# Patient Record
Sex: Male | Born: 1970 | Race: Black or African American | Hispanic: No | Marital: Married | Smoking: Never smoker
Health system: Southern US, Community
[De-identification: ages and names within clinical notes are randomized; demographics above are authoritative.]

## PROBLEM LIST (undated history)

## (undated) DIAGNOSIS — E785 Hyperlipidemia, unspecified: Secondary | ICD-10-CM

## (undated) DIAGNOSIS — R55 Syncope and collapse: Secondary | ICD-10-CM

## (undated) DIAGNOSIS — G4730 Sleep apnea, unspecified: Secondary | ICD-10-CM

## (undated) DIAGNOSIS — M1990 Unspecified osteoarthritis, unspecified site: Secondary | ICD-10-CM

## (undated) DIAGNOSIS — T7840XA Allergy, unspecified, initial encounter: Secondary | ICD-10-CM

## (undated) HISTORY — DX: Unspecified osteoarthritis, unspecified site: M19.90

## (undated) HISTORY — PX: SHOULDER ARTHROSCOPY WITH LABRAL REPAIR: SHX5691

## (undated) HISTORY — PX: KNEE SURGERY: SHX244

## (undated) HISTORY — DX: Sleep apnea, unspecified: G47.30

## (undated) HISTORY — DX: Allergy, unspecified, initial encounter: T78.40XA

## (undated) HISTORY — PX: TONSILLECTOMY: SUR1361

## (undated) HISTORY — PX: KNEE ARTHROSCOPY WITH MENISCAL REPAIR: SHX5653

## (undated) HISTORY — PX: BACK SURGERY: SHX140

## (undated) HISTORY — DX: Syncope and collapse: R55

## (undated) HISTORY — PX: ANKLE SURGERY: SHX546

## (undated) HISTORY — DX: Hyperlipidemia, unspecified: E78.5

---

## 2011-04-23 ENCOUNTER — Emergency Department (HOSPITAL_COMMUNITY)
Admission: EM | Admit: 2011-04-23 | Discharge: 2011-04-23 | Disposition: A | Discharge status: Home / Self Care | Payer: BC Managed Care – PPO | Attending: Emergency Medicine | Admitting: Emergency Medicine

## 2011-04-23 DIAGNOSIS — I4902 Ventricular flutter: Secondary | ICD-10-CM | POA: Insufficient documentation

## 2011-04-23 DIAGNOSIS — N289 Disorder of kidney and ureter, unspecified: Secondary | ICD-10-CM | POA: Insufficient documentation

## 2011-04-23 DIAGNOSIS — R002 Palpitations: Secondary | ICD-10-CM | POA: Insufficient documentation

## 2011-04-23 LAB — POCT I-STAT, CHEM 8
BUN: 20 mg/dL (ref 6–23)
Calcium, Ion: 1.22 mmol/L (ref 1.12–1.32)
Creatinine, Ser: 1.5 mg/dL — ABNORMAL HIGH (ref 0.50–1.35)
Hemoglobin: 16 g/dL (ref 13.0–17.0)
TCO2: 28 mmol/L (ref 0–100)

## 2011-04-23 LAB — CBC
HCT: 42.8 % (ref 39.0–52.0)
MCHC: 35.3 g/dL (ref 30.0–36.0)
Platelets: 207 10*3/uL (ref 150–400)
RDW: 13 % (ref 11.5–15.5)
WBC: 6.6 10*3/uL (ref 4.0–10.5)

## 2011-04-23 LAB — DIFFERENTIAL
Basophils Absolute: 0 10*3/uL (ref 0.0–0.1)
Eosinophils Absolute: 0.2 10*3/uL (ref 0.0–0.7)
Eosinophils Relative: 4 % (ref 0–5)
Lymphocytes Relative: 40 % (ref 12–46)
Monocytes Absolute: 0.4 10*3/uL (ref 0.1–1.0)

## 2011-04-23 LAB — POCT I-STAT TROPONIN I: Troponin i, poc: 0 ng/mL (ref 0.00–0.08)

## 2011-09-30 ENCOUNTER — Other Ambulatory Visit: Payer: Self-pay | Admitting: Family Medicine

## 2011-09-30 DIAGNOSIS — M545 Low back pain: Secondary | ICD-10-CM

## 2011-10-04 ENCOUNTER — Ambulatory Visit
Admission: RE | Admit: 2011-10-04 | Discharge: 2011-10-04 | Disposition: A | Discharge status: Home / Self Care | Payer: BC Managed Care – PPO | Source: Ambulatory Visit | Attending: Family Medicine | Admitting: Family Medicine

## 2011-10-04 DIAGNOSIS — M545 Low back pain: Secondary | ICD-10-CM

## 2012-10-09 ENCOUNTER — Other Ambulatory Visit: Payer: Self-pay | Admitting: Neurosurgery

## 2012-10-09 DIAGNOSIS — M5126 Other intervertebral disc displacement, lumbar region: Secondary | ICD-10-CM

## 2012-10-18 ENCOUNTER — Other Ambulatory Visit: Payer: BC Managed Care – PPO

## 2012-11-13 ENCOUNTER — Inpatient Hospital Stay: Admission: RE | Admit: 2012-11-13 | Payer: BC Managed Care – PPO | Source: Ambulatory Visit

## 2012-11-19 ENCOUNTER — Other Ambulatory Visit: Payer: Self-pay | Admitting: Neurosurgery

## 2012-11-19 DIAGNOSIS — M5126 Other intervertebral disc displacement, lumbar region: Secondary | ICD-10-CM

## 2012-11-27 ENCOUNTER — Ambulatory Visit
Admission: RE | Admit: 2012-11-27 | Discharge: 2012-11-27 | Disposition: A | Discharge status: Home / Self Care | Payer: BC Managed Care – PPO | Source: Ambulatory Visit | Attending: Neurosurgery | Admitting: Neurosurgery

## 2012-11-27 DIAGNOSIS — M5126 Other intervertebral disc displacement, lumbar region: Secondary | ICD-10-CM

## 2012-11-27 MED ORDER — GADOBENATE DIMEGLUMINE 529 MG/ML IV SOLN
20.0000 mL | Freq: Once | INTRAVENOUS | Status: AC | PRN
  Administered 2012-11-27: 20 mL via INTRAVENOUS

## 2013-11-18 ENCOUNTER — Emergency Department (HOSPITAL_COMMUNITY): Payer: BC Managed Care – PPO

## 2013-11-18 ENCOUNTER — Encounter (HOSPITAL_COMMUNITY): Payer: Self-pay | Admitting: Emergency Medicine

## 2013-11-18 ENCOUNTER — Emergency Department (HOSPITAL_COMMUNITY)
Admission: EM | Admit: 2013-11-18 | Discharge: 2013-11-18 | Disposition: A | Discharge status: Home / Self Care | Payer: BC Managed Care – PPO | Attending: Emergency Medicine | Admitting: Emergency Medicine

## 2013-11-18 DIAGNOSIS — K529 Noninfective gastroenteritis and colitis, unspecified: Secondary | ICD-10-CM

## 2013-11-18 DIAGNOSIS — K5289 Other specified noninfective gastroenteritis and colitis: Secondary | ICD-10-CM | POA: Insufficient documentation

## 2013-11-18 DIAGNOSIS — R0602 Shortness of breath: Secondary | ICD-10-CM | POA: Insufficient documentation

## 2013-11-18 LAB — COMPREHENSIVE METABOLIC PANEL
ALK PHOS: 60 U/L (ref 39–117)
ALT: 25 U/L (ref 0–53)
AST: 30 U/L (ref 0–37)
Albumin: 4.5 g/dL (ref 3.5–5.2)
BUN: 13 mg/dL (ref 6–23)
CHLORIDE: 102 meq/L (ref 96–112)
CO2: 28 meq/L (ref 19–32)
Calcium: 10.1 mg/dL (ref 8.4–10.5)
Creatinine, Ser: 1.18 mg/dL (ref 0.50–1.35)
GFR, EST AFRICAN AMERICAN: 88 mL/min — AB (ref >90)
GFR, EST NON AFRICAN AMERICAN: 76 mL/min — AB (ref >90)
GLUCOSE: 97 mg/dL (ref 70–99)
POTASSIUM: 4.3 meq/L (ref 3.7–5.3)
SODIUM: 141 meq/L (ref 137–147)
Total Bilirubin: 0.4 mg/dL (ref 0.3–1.2)
Total Protein: 8.5 g/dL — ABNORMAL HIGH (ref 6.0–8.3)

## 2013-11-18 LAB — CBC WITH DIFFERENTIAL/PLATELET
BASOS ABS: 0 10*3/uL (ref 0.0–0.1)
Basophils Relative: 0 % (ref 0–1)
EOS PCT: 1 % (ref 0–5)
Eosinophils Absolute: 0.1 10*3/uL (ref 0.0–0.7)
HCT: 45.5 % (ref 39.0–52.0)
Hemoglobin: 15.3 g/dL (ref 13.0–17.0)
LYMPHS ABS: 1.1 10*3/uL (ref 0.7–4.0)
LYMPHS PCT: 13 % (ref 12–46)
MCH: 31.7 pg (ref 26.0–34.0)
MCHC: 33.6 g/dL (ref 30.0–36.0)
MCV: 94.4 fL (ref 78.0–100.0)
Monocytes Absolute: 0.7 10*3/uL (ref 0.1–1.0)
Monocytes Relative: 8 % (ref 3–12)
NEUTROS ABS: 6.5 10*3/uL (ref 1.7–7.7)
NEUTROS PCT: 78 % — AB (ref 43–77)
PLATELETS: 205 10*3/uL (ref 150–400)
RBC: 4.82 MIL/uL (ref 4.22–5.81)
RDW: 14.1 % (ref 11.5–15.5)
WBC: 8.4 10*3/uL (ref 4.0–10.5)

## 2013-11-18 LAB — URINALYSIS, ROUTINE W REFLEX MICROSCOPIC
BILIRUBIN URINE: NEGATIVE
GLUCOSE, UA: NEGATIVE mg/dL
Ketones, ur: NEGATIVE mg/dL
Leukocytes, UA: NEGATIVE
Nitrite: NEGATIVE
PROTEIN: NEGATIVE mg/dL
Specific Gravity, Urine: >1.03 — ABNORMAL HIGH (ref 1.005–1.030)
UROBILINOGEN UA: 0.2 mg/dL (ref 0.0–1.0)
pH: 6 (ref 5.0–8.0)

## 2013-11-18 LAB — LIPASE, BLOOD: Lipase: 36 U/L (ref 11–59)

## 2013-11-18 LAB — URINE MICROSCOPIC-ADD ON

## 2013-11-18 MED ORDER — ACETAMINOPHEN 10 MG/ML IV SOLN
1000.0000 mg | Freq: Four times a day (QID) | INTRAVENOUS | Status: DC
  Administered 2013-11-18: 1000 mg via INTRAVENOUS

## 2013-11-18 MED ORDER — MORPHINE SULFATE 4 MG/ML IJ SOLN
4.0000 mg | Freq: Once | INTRAMUSCULAR | Status: DC
  Filled 2013-11-18: qty 1

## 2013-11-18 MED ORDER — ONDANSETRON 4 MG PO TBDP: ORAL_TABLET | ORAL | Status: DC

## 2013-11-18 MED ORDER — IOHEXOL 300 MG/ML  SOLN
100.0000 mL | Freq: Once | INTRAMUSCULAR | Status: AC | PRN
  Administered 2013-11-18: 100 mL via INTRAVENOUS

## 2013-11-18 MED ORDER — IOHEXOL 300 MG/ML  SOLN
50.0000 mL | Freq: Once | INTRAMUSCULAR | Status: AC | PRN
  Administered 2013-11-18: 50 mL via ORAL

## 2013-11-18 MED ORDER — SODIUM CHLORIDE 0.9 % IV BOLUS (SEPSIS)
1000.0000 mL | Freq: Once | INTRAVENOUS | Status: AC
  Administered 2013-11-18: 1000 mL via INTRAVENOUS

## 2013-11-18 MED ORDER — ACETAMINOPHEN 10 MG/ML IV SOLN
INTRAVENOUS | Status: AC
  Filled 2013-11-18: qty 100

## 2013-11-18 MED ORDER — DICYCLOMINE HCL 20 MG PO TABS: ORAL_TABLET | ORAL | Status: DC

## 2013-11-18 NOTE — ED Provider Notes (Signed)
CSN: [id number]     Arrival date & time 11/18/13  1250 History   This chart was scribed for David LennertJoseph L Ayvah Caroll, MD by Manuela Schwartzaylor Day, ED scribe. This patient was seen in room APA03/APA03 and the patient's care was started at 1250.  Chief Complaint  Patient presents with  . Abdominal Pain   Patient is a 43 y.o. male presenting with abdominal pain. The history is provided by the patient. No language interpreter was used.  Abdominal Pain Pain location:  LLQ and RLQ Pain quality: sharp   Pain radiates to:  Does not radiate Pain severity:  Moderate Onset quality:  Gradual Duration:  1 day Timing:  Constant Progression:  Worsening Chronicity:  New Relieved by:  Nothing Worsened by:  Nothing tried Ineffective treatments:  OTC medications Associated symptoms: nausea and shortness of breath   Associated symptoms: no chest pain, no chills, no cough, no fever and no vomiting    HPI Comments: Langston ReusingBrian Malbrough is a 43 y.o. male who presents to the Emergency Department complaining of sharp, bilateral lower abdominal pain onset yesterday but worsened while he was on a work call a few hours ago. He reports feeling SOB while pain was at its worst. He states no relief w/pepto bismol. He reports associated nausea and x3 episodes of watery diarrhea this AM and has been dry heaving. He denies any cough or fever.   History reviewed. No pertinent past medical history. Past Surgical History  Procedure Laterality Date  . Shoulder arthroscopy with labral repair    . Back surgery    . Knee arthroscopy with meniscal repair     History reviewed. No pertinent family history. History  Substance Use Topics  . Smoking status: Never Smoker   . Smokeless tobacco: Not on file  . Alcohol Use: Yes     Comment: occasional    Review of Systems  Constitutional: Negative for fever and chills.  Respiratory: Positive for shortness of breath. Negative for cough.   Cardiovascular: Negative for chest pain.  Gastrointestinal:  Positive for nausea and abdominal pain. Negative for vomiting.  Musculoskeletal: Negative for back pain.  Skin: Negative for rash.  All other systems reviewed and are negative.   Allergies  Review of patient's allergies indicates no known allergies.  Home Medications  No current outpatient prescriptions on file.  Triage Vitals: BP 122/75  Pulse 83  Temp(Src) 98.3 F (36.8 C) (Oral)  Resp 18  Ht 6\' 5"  (1.956 m)  Wt 235 lb (106.595 kg)  BMI 27.86 kg/m2  SpO2 99%  Physical Exam  Nursing note and vitals reviewed. Constitutional: He is oriented to person, place, and time. He appears well-developed.  HENT:  Head: Normocephalic.  Eyes: Conjunctivae and EOM are normal. No scleral icterus.  Neck: Neck supple. No thyromegaly present.  Cardiovascular: Normal rate and regular rhythm.  Exam reveals no gallop and no friction rub.   No murmur heard. Pulmonary/Chest: Effort normal and breath sounds normal. No stridor. No respiratory distress. He has no wheezes. He has no rales. He exhibits no tenderness.  Abdominal: Soft. He exhibits no distension. There is tenderness. There is no rebound.  Moderate LLQ and RLQ tenderness to palpation  Musculoskeletal: Normal range of motion. He exhibits no edema.  Lymphadenopathy:    He has no cervical adenopathy.  Neurological: He is oriented to person, place, and time. He exhibits normal muscle tone. Coordination normal.  Skin: No rash noted. No erythema.  Psychiatric: He has a normal mood and affect. His  behavior is normal.    ED Course  Procedures (including critical care time) DIAGNOSTIC STUDIES: Oxygen Saturation is 99% on room air, normal by my interpretation.    COORDINATION OF CARE: At 145 PM Discussed treatment plan with patient which includes abdominal X-ray, blood work, UA. Patient agrees.   Labs Review Labs Reviewed - No data to display Imaging Review No results found.   EKG Interpretation None      MDM   Final diagnoses:   None    gastroenteritis I personally performed the services described in this documentation, which was scribed in my presence. The recorded information has been reviewed and is accurate.      David Lennert, MD 11/18/13 [phone]

## 2013-11-18 NOTE — ED Notes (Signed)
Patient requesting pain medication. MD aware. Verbal order for 4 mg Morphine IV obtained.

## 2013-11-18 NOTE — ED Notes (Signed)
Patient with no complaints at this time. Respirations even and unlabored. Skin warm/dry. Discharge instructions reviewed with patient at this time. Patient given opportunity to voice concerns/ask questions. IV removed per policy and band-aid applied to site. Patient discharged at this time and left Emergency Department with steady gait.  

## 2013-11-18 NOTE — Discharge Instructions (Signed)
Drink plenty of fluids.  Tylenol or motrin for pain as needed.  Follow up in 2-3 days if not improving.

## 2013-11-18 NOTE — ED Notes (Signed)
Pt reports intermittent sharp, cramping abdominal pain since yesterday.  Reports nausea and diarrhea.

## 2015-07-17 IMAGING — CR DG ABDOMEN ACUTE W/ 1V CHEST
4 series · 4 of 4 positions shown · non-contrast
Comparison: None

CLINICAL DATA: Abdominal pain

EXAM:
ACUTE ABDOMEN SERIES (ABDOMEN 2 VIEW & CHEST 1 VIEW)

[view not recorded (1 of 4)]
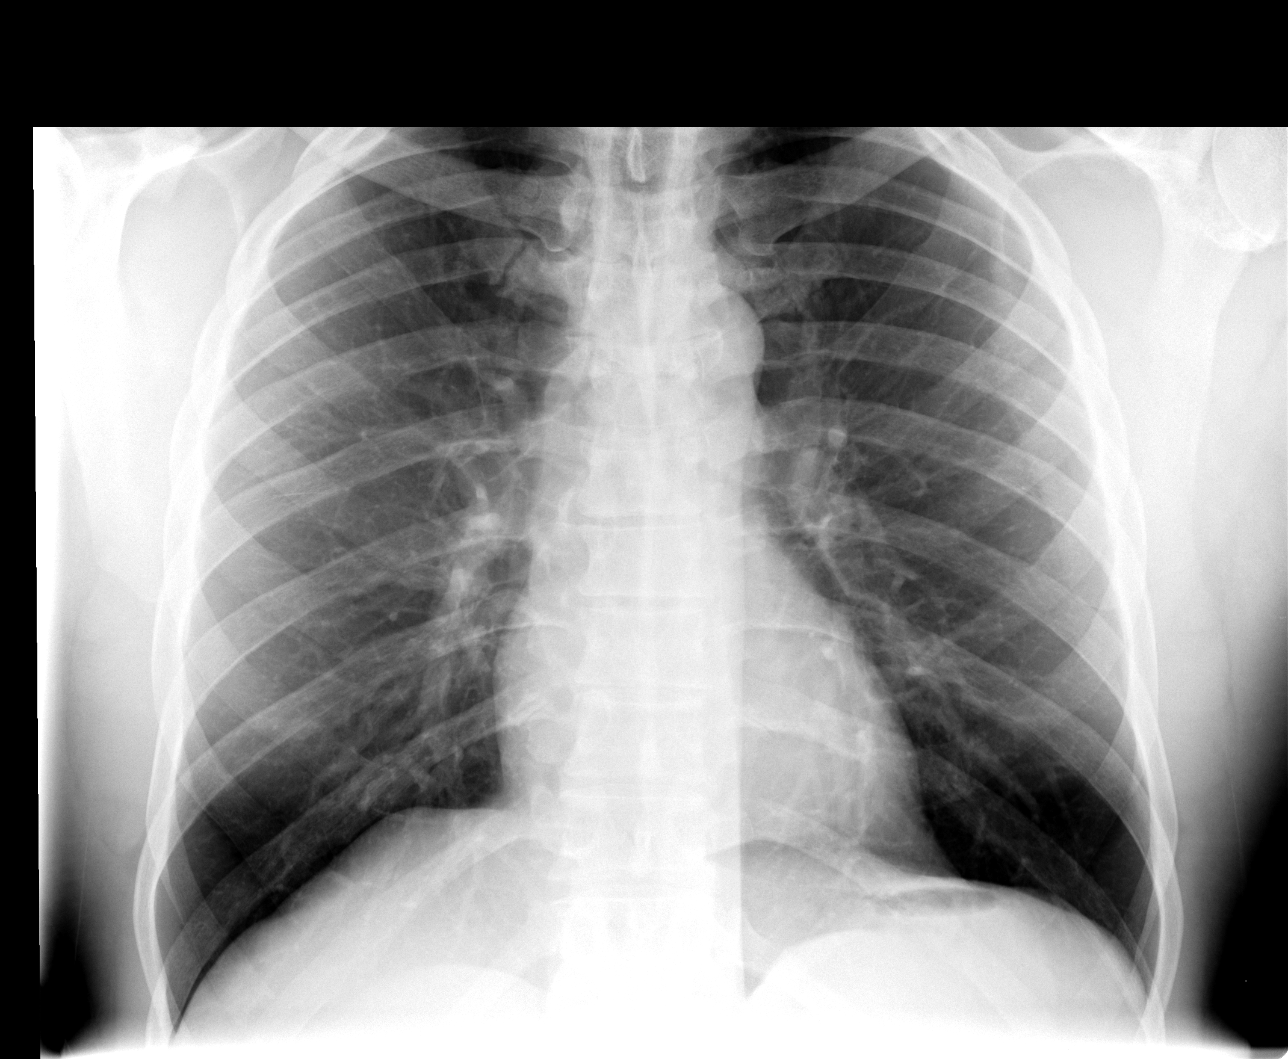

[view not recorded (2 of 4)]
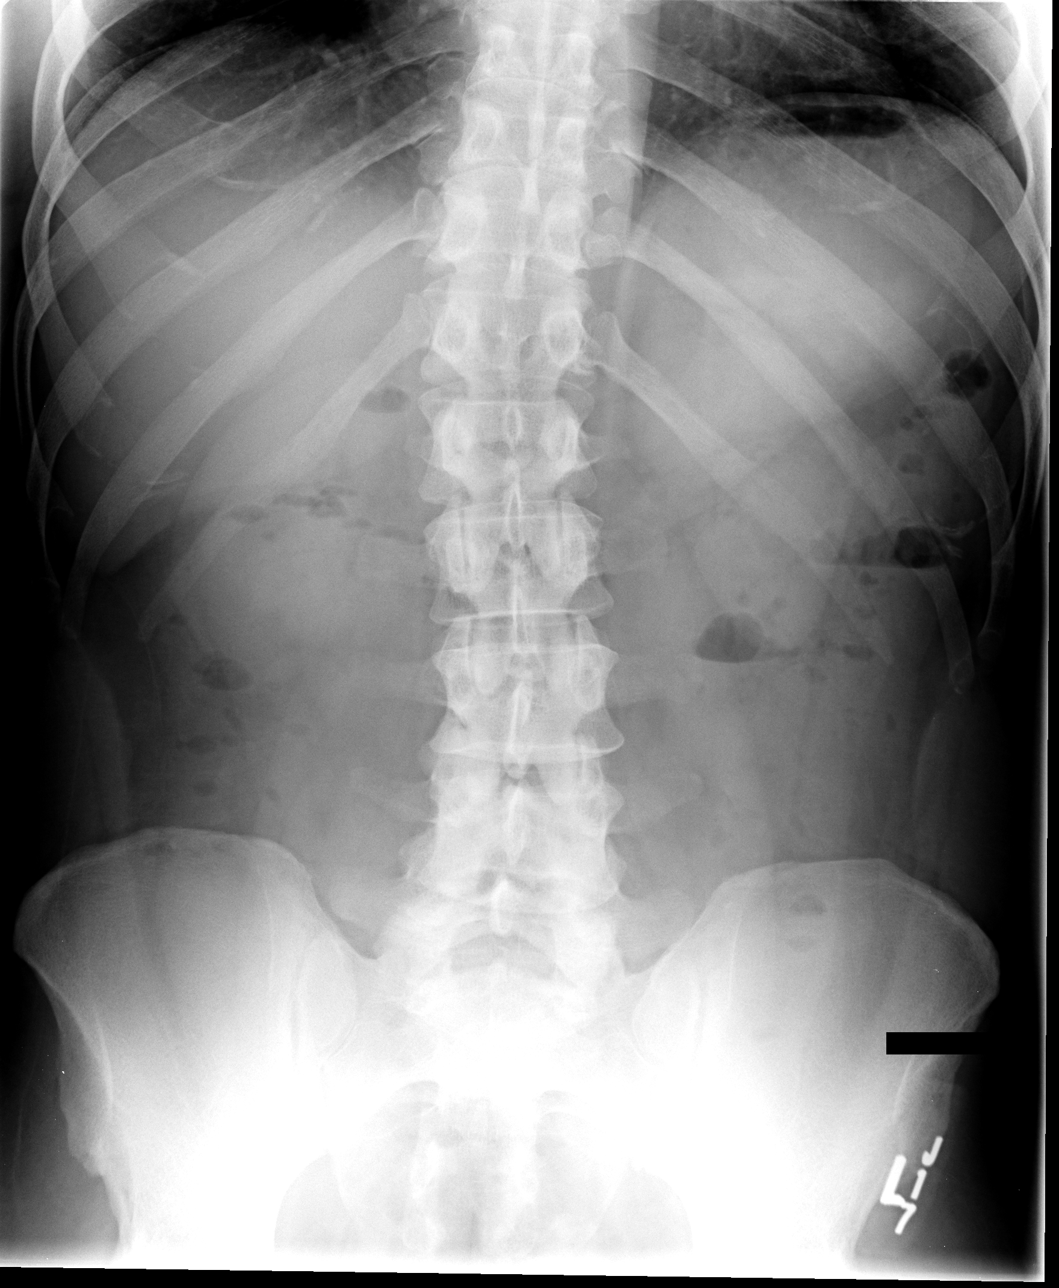

[view not recorded (3 of 4)]
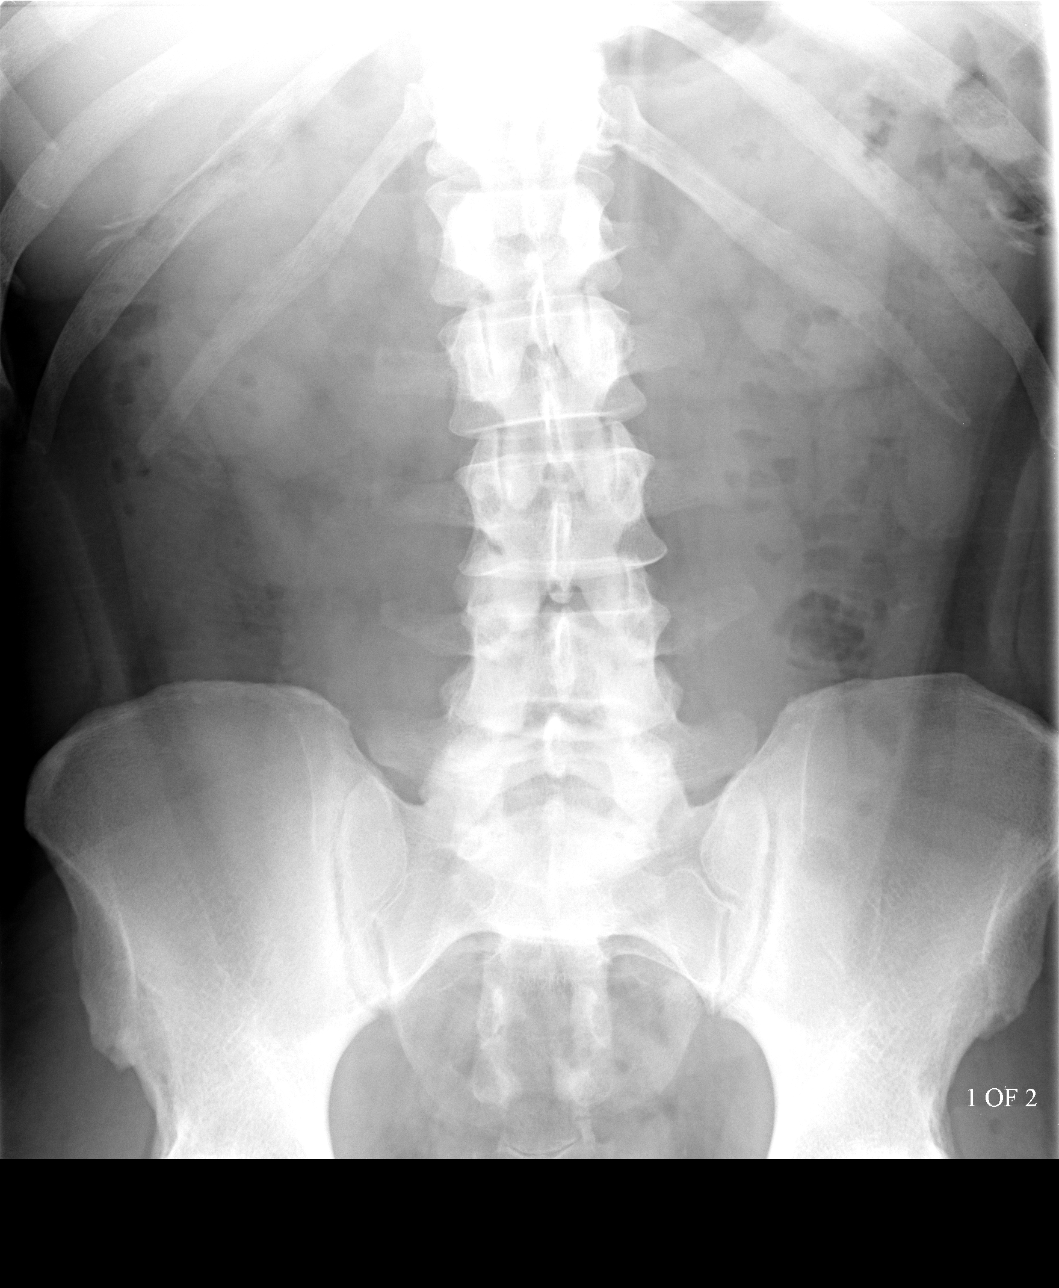

[view not recorded (4 of 4)]
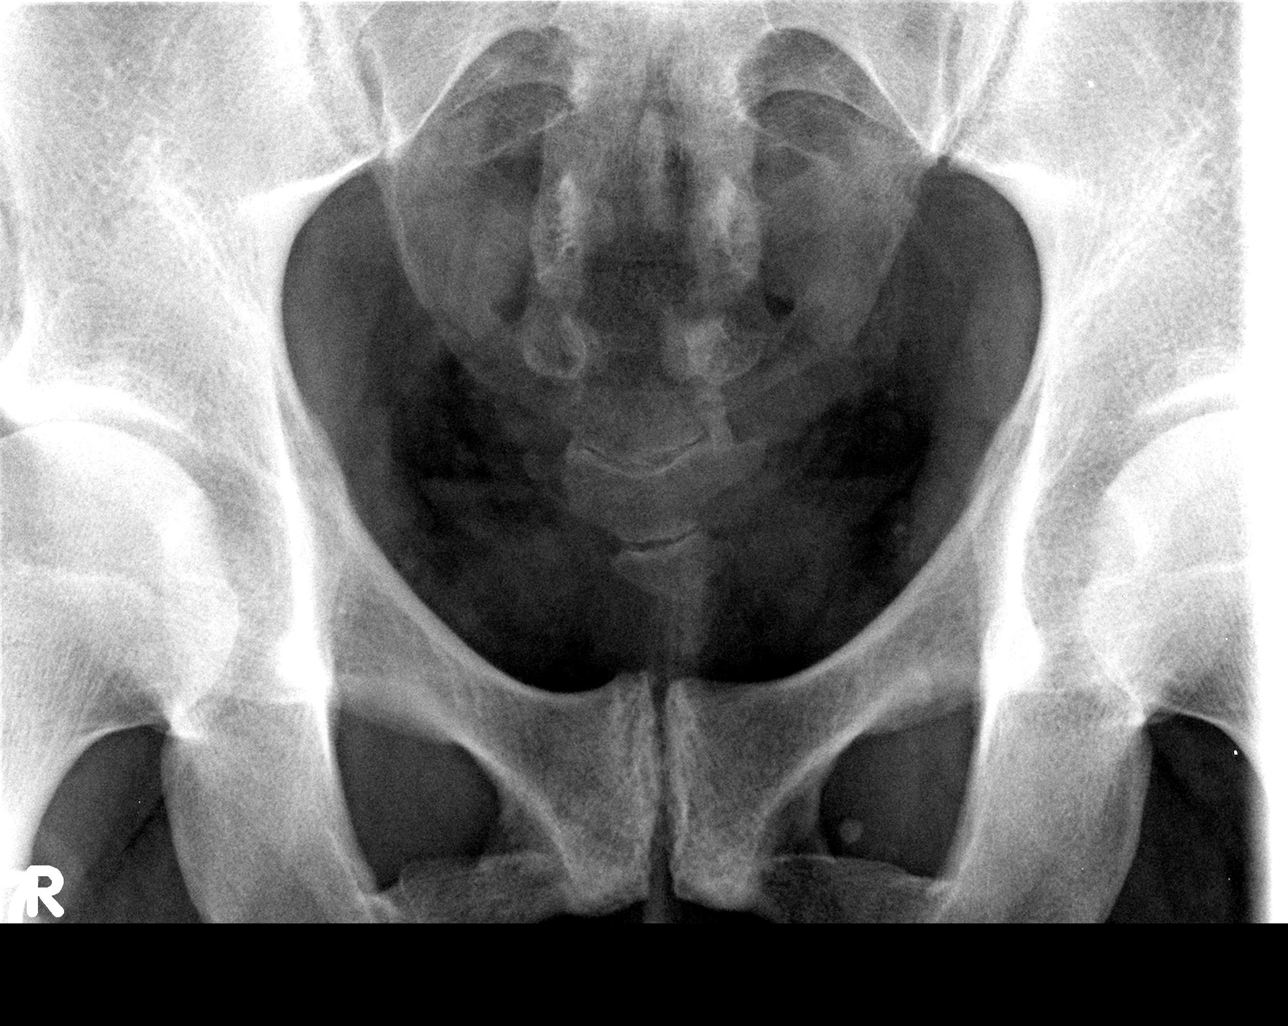

[4 of 4 positions shown; findings below may reference images not displayed]

FINDINGS: There is no evidence of dilated bowel loops or free intraperitoneal
air. A few air-fluid levels are identified within the upper abdomen.
No radiopaque calculi or other significant radiographic abnormality
is seen. Heart size and mediastinal contours are within normal
limits. Both lungs are clear.
IMPRESSION: 1. Nonspecific bowel gas pattern with a few scattered air-fluid
levels. No dilated bowel loops identified

## 2016-08-08 ENCOUNTER — Encounter (HOSPITAL_COMMUNITY): Payer: Self-pay | Admitting: *Deleted

## 2016-08-08 ENCOUNTER — Ambulatory Visit (HOSPITAL_COMMUNITY)
Admission: EM | Admit: 2016-08-08 | Discharge: 2016-08-08 | Disposition: A | Discharge status: Home / Self Care | Payer: Self-pay | Attending: Family Medicine | Admitting: Family Medicine

## 2016-08-08 DIAGNOSIS — R0602 Shortness of breath: Secondary | ICD-10-CM

## 2016-08-08 DIAGNOSIS — Z0000 Encounter for general adult medical examination without abnormal findings: Secondary | ICD-10-CM

## 2016-08-08 NOTE — ED Triage Notes (Signed)
Pt   Went   To   The   Employee  Wellness   9  Nov      To  Have  A  Health   Certificate  At  Work  At      That  Time        The  Nurse   Advised     Him  To  Have  furthur  evaualtion     Pt  Has  Had  No  Problems   Since  Then   And   Has  Not   Seen a  Provider  Since  Then     And  Is  Here  Today   To  Be  Seen  By the  Provider     For   Clearance     Pt  Has  No  Symptoms   Today or  Since  9   November     He  Is  Sitting       Upright      Speaking  In      Complete       sentances

## 2016-08-08 NOTE — ED Provider Notes (Signed)
[facility]    CSN: [id number] Arrival date & time: 08/08/16  1003     History   Chief Complaint Chief Complaint  Patient presents with  . Follow-up    HPI David Huang is a 45 y.o. male.   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  He's had never had any chest pain or heart problems. He denies hypertension or diabetes. He does not smoke.      History reviewed. No pertinent past medical history.  There are no active problems to display for this patient.   Past Surgical History:  Procedure Laterality Date  . BACK SURGERY    . KNEE ARTHROSCOPY WITH MENISCAL REPAIR    . SHOULDER ARTHROSCOPY WITH LABRAL REPAIR         Home Medications    Prior to Admission medications   Medication Sig Start Date End Date Taking? Authorizing Provider  Multiple Vitamin (MULTI VITAMIN DAILY PO) Take 2 tablets by mouth daily.    Historical Provider, MD    Family History History reviewed. No pertinent family history.  Social History Social History  Substance Use Topics  . Smoking status: Never Smoker  . Smokeless tobacco: Never Used  . Alcohol use Yes     Comment: occasional     Allergies   Patient has no known allergies.   Review of Systems Review of Systems  Constitutional: Negative.   HENT: Negative.   Eyes: Negative.   Respiratory: Negative.   Cardiovascular: Negative.   Gastrointestinal: Negative.   Musculoskeletal: Negative.   Neurological: Negative.      Physical Exam Triage Vital Signs ED Triage Vitals [08/08/16 1036]  Enc Vitals Group     BP 120/76     Pulse Rate 78     Resp 18     Temp 98.6 F (37 C)     Temp src      SpO2 100 %     Weight      Height      Head  Circumference      Peak Flow      Pain Score      Pain Loc      Pain Edu?      Excl. in GC?    No data found.   Updated Vital Signs BP 120/76 (BP Location: Left Arm)   Pulse 78   Temp 98.6 F (37 C)   Resp 18   SpO2 100%    Physical Exam  Constitutional: He is oriented to person, place, and time. He appears well-developed and well-nourished.  HENT:  Head: Normocephalic.  Right Ear: External ear normal.  Left Ear: External ear normal.  Nose: Nose normal.  Mouth/Throat: Oropharynx is clear and moist.  Eyes: Conjunctivae and EOM are normal.  Neck: Normal range of motion. Neck supple.  Cardiovascular: Normal rate, regular rhythm and normal heart sounds.   Pulmonary/Chest: Effort normal and breath sounds normal.  Musculoskeletal: Normal range of motion.  Neurological: He is alert and oriented to person, place, and time.  Skin: Skin is warm and dry.  Nursing note and vitals reviewed.    UC Treatments / Results  Labs (all labs ordered are listed, but only abnormal results are displayed) Labs  Reviewed - No data to display  EKG  EKG Interpretation None       Radiology No results found.  Procedures Procedures (including critical care time)  Medications Ordered in UC Medications - No data to display   Initial Impression / Assessment and Plan / UC Course  I have reviewed the triage vital signs and the nursing notes.  Pertinent labs & imaging results that were available during my care of the patient were reviewed by me and considered in my medical decision making (see chart for details).  Clinical Course     Final Clinical Impressions(s) / UC Diagnoses   Final diagnoses:  Wellness examination    New Prescriptions Current Discharge Medication List       Elvina SidleKurt Bernadetta Roell, MD 08/08/16 1104

## 2017-05-21 ENCOUNTER — Encounter (HOSPITAL_COMMUNITY): Payer: Self-pay | Admitting: *Deleted

## 2017-05-21 ENCOUNTER — Ambulatory Visit (HOSPITAL_COMMUNITY)
Admission: EM | Admit: 2017-05-21 | Discharge: 2017-05-21 | Disposition: A | Discharge status: Home / Self Care | Payer: BC Managed Care – PPO | Attending: Family Medicine | Admitting: Family Medicine

## 2017-05-21 DIAGNOSIS — K068 Other specified disorders of gingiva and edentulous alveolar ridge: Secondary | ICD-10-CM | POA: Diagnosis not present

## 2017-05-21 MED ORDER — BENZOCAINE 10 % MT GEL: 1.0000 "application " | OROMUCOSAL | 0 refills | Status: DC | PRN

## 2017-05-21 MED ORDER — CHLORHEXIDINE GLUCONATE 0.12 % MT SOLN: 15.0000 mL | Freq: Two times a day (BID) | OROMUCOSAL | 0 refills | Status: DC

## 2017-05-21 MED ORDER — NAPROXEN 500 MG PO TABS: 500.0000 mg | ORAL_TABLET | Freq: Two times a day (BID) | ORAL | 0 refills | Status: DC

## 2017-05-21 MED ORDER — NAPROXEN 500 MG PO TABS: 500.0000 mg | ORAL_TABLET | Freq: Two times a day (BID) | ORAL | 0 refills | Status: AC

## 2017-05-21 NOTE — ED Provider Notes (Signed)
[facility]    CSN: [id number] Arrival date & time: 05/21/17  1830     History   Chief Complaint Chief Complaint  Patient presents with  . Dental Pain    HPI David Huang is a 46 y.o. male.   46 year old male comes in for four-day history of mouth/gum pain. Patient states pain is located at left back jaw, and is more painful with swallowing. He has been using orajel with good relief. No injury to tooth, no known cracks/caries that he knows of. Recent dentist appointment 1-2 months ago. Still has his wisdom tooth. Denies shortness of breath, trouble breathing, swelling of the throat. Denies sore throat. Denies fever, chills, night sweats.       History reviewed. No pertinent past medical history.  There are no active problems to display for this patient.   Past Surgical History:  Procedure Laterality Date  . BACK SURGERY    . KNEE ARTHROSCOPY WITH MENISCAL REPAIR    . SHOULDER ARTHROSCOPY WITH LABRAL REPAIR         Home Medications    Prior to Admission medications   Medication Sig Start Date End Date Taking? Authorizing Provider  benzocaine (ORAJEL) 10 % mucosal gel Use as directed 1 application in the mouth or throat as needed for mouth pain. 05/21/17   Cathie Hoops, Geary Rufo V, PA-C  chlorhexidine (PERIDEX) 0.12 % solution Use as directed 15 mLs in the mouth or throat 2 (two) times daily. 05/21/17   Cathie Hoops, Mariyam Remington V, PA-C  Multiple Vitamin (MULTI VITAMIN DAILY PO) Take 2 tablets by mouth daily.    [provider]  naproxen (NAPROSYN) 500 MG tablet Take 1 tablet (500 mg total) by mouth 2 (two) times daily. 05/21/17 05/31/17  Belinda Fisher, PA-C    Family History History reviewed. No pertinent family history.  Social History Social History  Substance Use Topics  . Smoking status: Never Smoker  . Smokeless tobacco: Never Used  . Alcohol use Yes     Comment: occasional     Allergies   Patient has no known allergies.   Review of Systems Review of Systems  Reason  unable to perform ROS: See HPI as above.     Physical Exam Triage Vital Signs ED Triage Vitals [05/21/17 1910]  Enc Vitals Group     BP      Pulse      Resp      Temp      Temp src      SpO2      Weight      Height      Head Circumference      Peak Flow      Pain Score 4     Pain Loc      Pain Edu?      Excl. in GC?    No data found.   Updated Vital Signs There were no vitals taken for this visit.   Physical Exam  Constitutional: He is oriented to person, place, and time. He appears well-developed and well-nourished. No distress.  HENT:  Head: Normocephalic and atraumatic.  Mouth/Throat: Uvula is midline, oropharynx is clear and moist and mucous membranes are normal.    Eyes: Pupils are equal, round, and reactive to light. Conjunctivae are normal.  Neurological: He is alert and oriented to person, place, and time.     UC Treatments / Results  Labs (all labs ordered are listed, but only abnormal results are displayed) Labs Reviewed - No  data to display  EKG  EKG Interpretation None       Radiology No results found.  Procedures Procedures (including critical care time)  Medications Ordered in UC Medications - No data to display   Initial Impression / Assessment and Plan / UC Course  I have reviewed the triage vital signs and the nursing notes.  Pertinent labs & imaging results that were available during my care of the patient were reviewed by me and considered in my medical decision making (see chart for details).    Swelling with tenderness around left lower wisdom tooth. Discussed irritation possibly due to growing of wisdom tooth. Orajel/naproxen for pain. Peridex for oral hygiene. Follow up with dentist for further evaluation and treatment needed.   Final Clinical Impressions(s) / UC Diagnoses   Final diagnoses:  Pain in gums    New Prescriptions Discharge Medication List as of 05/21/2017  7:49 PM        Belinda Fisher, PA-C 05/21/17  2139

## 2017-05-21 NOTE — Discharge Instructions (Signed)
Use orajel and naproxen as directed for pain. Peridex for oral hygiene. Follow up with dentist for further evaluation and treatment needed.

## 2017-05-21 NOTE — ED Triage Notes (Signed)
Pt   Woke  Up  With   Soreness    In  l  Lower     Side  Of  Mouth   With  Pain  When he   Swallows     Pt  Reports  The   Symptoms   releived   By  orajel     Pt  Reports  He  Had   Sensation of  Some   Swelling  l  Side  Of throat  As   Well  He  Is  Sitting upright on  Exam table  Speaking in  Complete sentances

## 2019-05-31 ENCOUNTER — Encounter: Payer: Self-pay | Admitting: Family Medicine

## 2019-05-31 ENCOUNTER — Ambulatory Visit (INDEPENDENT_AMBULATORY_CARE_PROVIDER_SITE_OTHER): Payer: BC Managed Care – PPO | Admitting: Family Medicine

## 2019-05-31 ENCOUNTER — Other Ambulatory Visit: Payer: Self-pay

## 2019-05-31 VITALS — BP 124/82 | HR 84 | Temp 97.2°F | Ht 77.0 in | Wt 203.2 lb

## 2019-05-31 DIAGNOSIS — Z0001 Encounter for general adult medical examination with abnormal findings: Secondary | ICD-10-CM

## 2019-05-31 DIAGNOSIS — N529 Male erectile dysfunction, unspecified: Secondary | ICD-10-CM

## 2019-05-31 DIAGNOSIS — E785 Hyperlipidemia, unspecified: Secondary | ICD-10-CM | POA: Diagnosis not present

## 2019-05-31 DIAGNOSIS — F909 Attention-deficit hyperactivity disorder, unspecified type: Secondary | ICD-10-CM | POA: Diagnosis not present

## 2019-05-31 DIAGNOSIS — Z125 Encounter for screening for malignant neoplasm of prostate: Secondary | ICD-10-CM | POA: Diagnosis not present

## 2019-05-31 DIAGNOSIS — Z23 Encounter for immunization: Secondary | ICD-10-CM | POA: Diagnosis not present

## 2019-05-31 DIAGNOSIS — R7303 Prediabetes: Secondary | ICD-10-CM | POA: Diagnosis not present

## 2019-05-31 LAB — COMPREHENSIVE METABOLIC PANEL
ALT: 15 U/L (ref 0–53)
AST: 22 U/L (ref 0–37)
Albumin: 4.7 g/dL (ref 3.5–5.2)
Alkaline Phosphatase: 49 U/L (ref 39–117)
BUN: 17 mg/dL (ref 6–23)
CO2: 30 mEq/L (ref 19–32)
Calcium: 10.3 mg/dL (ref 8.4–10.5)
Chloride: 101 mEq/L (ref 96–112)
Creatinine, Ser: 1.39 mg/dL (ref 0.40–1.50)
GFR: 66.6 mL/min (ref >60.00)
Glucose, Bld: 89 mg/dL (ref 70–99)
Potassium: 5 mEq/L (ref 3.5–5.1)
Sodium: 137 mEq/L (ref 135–145)
Total Bilirubin: 0.4 mg/dL (ref 0.2–1.2)
Total Protein: 7.2 g/dL (ref 6.0–8.3)

## 2019-05-31 LAB — LIPID PANEL
Cholesterol: 202 mg/dL — ABNORMAL HIGH (ref 0–200)
HDL: 65.8 mg/dL (ref >39.00)
LDL Cholesterol: 127 mg/dL — ABNORMAL HIGH (ref 0–99)
NonHDL: 136.53
Total CHOL/HDL Ratio: 3
Triglycerides: 49 mg/dL (ref 0.0–149.0)
VLDL: 9.8 mg/dL (ref 0.0–40.0)

## 2019-05-31 LAB — TSH: TSH: 0.6 u[IU]/mL (ref 0.35–4.50)

## 2019-05-31 LAB — HEMOGLOBIN A1C: Hgb A1c MFr Bld: 5.7 % (ref 4.6–6.5)

## 2019-05-31 LAB — PSA: PSA: 0.84 ng/mL (ref 0.10–4.00)

## 2019-05-31 LAB — CBC
HCT: 43.8 % (ref 39.0–52.0)
Hemoglobin: 14.8 g/dL (ref 13.0–17.0)
MCHC: 33.7 g/dL (ref 30.0–36.0)
MCV: 98.7 fl (ref 78.0–100.0)
Platelets: 216 10*3/uL (ref 150.0–400.0)
RBC: 4.44 Mil/uL (ref 4.22–5.81)
RDW: 14.3 % (ref 11.5–15.5)
WBC: 4.7 10*3/uL (ref 4.0–10.5)

## 2019-05-31 NOTE — Progress Notes (Signed)
Chief Complaint:  David Huang is a 48 y.o. male who presents today for his annual comprehensive physical exam.    Assessment/Plan:  Attention deficit hyperactivity disorder (ADHD) Continue Strattera per attention specialist.  Dyslipidemia Continue lifestyle modifications.  Check CBC, C met, TSH, and lipid panel.  Prediabetes Check A1c.  Erectile dysfunction Continue Viagra 100 mg daily as needed.  Preventative Healthcare: Flu deferred. Tdap given today. Check CBC, C met, TSH, lipid panel, and PSA.  Patient Counseling(The following topics were reviewed and/or handout was given):  -Nutrition: Stressed importance of moderation in sodium/caffeine intake, saturated fat and cholesterol, caloric balance, sufficient intake of fresh fruits, vegetables, and fiber.  -Stressed the importance of regular exercise.   -Substance Abuse: Discussed cessation/primary prevention of tobacco, alcohol, or other drug use; driving or other dangerous activities under the influence; availability of treatment for abuse.   -Injury prevention: Discussed safety belts, safety helmets, smoke detector, smoking near bedding or upholstery.   -Sexuality: Discussed sexually transmitted diseases, partner selection, use of condoms, avoidance of unintended pregnancy and contraceptive alternatives.   -Dental health: Discussed importance of regular tooth brushing, flossing, and dental visits.  -Health maintenance and immunizations reviewed. Please refer to Health maintenance section.  Return to care in 1 year for next preventative visit.     Subjective:  HPI:  He has no acute complaints today.   His stable, chronic medical conditions are outlined below:   # ED -Uses sildenafil 100 mg daily as needed.  Tolerates well.  # ADHD - Follows with attention specialists - On strattera 41m daily and tolerating well  Lifestyle Diet: No specific diets or eating plans.  Exercise: Still works out 4-5 times per week. Likes  to lift weights and do cardio.   Depression screen PHQ 2/9 05/31/2019  Decreased Interest 0  Down, Depressed, Hopeless 0  PHQ - 2 Score 0    Health Maintenance Due  Topic Date Due  . HIV Screening  07/09/1988  . TETANUS/TDAP  07/09/1992     ROS: Per HPI, otherwise a complete review of systems was negative.   PMH:  The following were reviewed and entered/updated in epic: Past Medical History:  Diagnosis Date  . Fainting spell   . Hyperlipidemia    Patient Active Problem List   Diagnosis Date Noted  . Prediabetes 05/31/2019  . Dyslipidemia 05/31/2019  . Attention deficit hyperactivity disorder (ADHD) 05/31/2019  . Erectile dysfunction 05/31/2019   Past Surgical History:  Procedure Laterality Date  . ANKLE SURGERY Left   . BACK SURGERY    . KNEE ARTHROSCOPY WITH MENISCAL REPAIR    . KNEE SURGERY Left   . SHOULDER ARTHROSCOPY WITH LABRAL REPAIR      Family History  Problem Relation Age of Onset  . Cancer Father   . Liver cancer Father   . Cancer Paternal Aunt   . Prostate cancer Neg Hx   . Colon cancer Neg Hx     Medications- reviewed and updated Current Outpatient Medications  Medication Sig Dispense Refill  . atomoxetine (STRATTERA) 80 MG capsule 80 mg daily.    . Multiple Vitamin (MULTI VITAMIN DAILY PO) Take 2 tablets by mouth daily.    . sildenafil (VIAGRA) 100 MG tablet Take 100 mg by mouth daily as needed.     No current facility-administered medications for this visit.     Allergies-reviewed and updated No Known Allergies  Social History   Socioeconomic History  . Marital status: Married    Spouse name:  Not on file  . Number of children: Not on file  . Years of education: Not on file  . Highest education level: Not on file  Occupational History  . Not on file  Social Needs  . Financial resource strain: Not on file  . Food insecurity    Worry: Not on file    Inability: Not on file  . Transportation needs    Medical: Not on file     Non-medical: Not on file  Tobacco Use  . Smoking status: Never Smoker  . Smokeless tobacco: Never Used  Substance and Sexual Activity  . Alcohol use: Yes    Comment: occasional  . Drug use: No  . Sexual activity: Not on file  Lifestyle  . Physical activity    Days per week: Not on file    Minutes per session: Not on file  . Stress: Not on file  Relationships  . Social Herbalist on phone: Not on file    Gets together: Not on file    Attends religious service: Not on file    Active member of club or organization: Not on file    Attends meetings of clubs or organizations: Not on file    Relationship status: Not on file  Other Topics Concern  . Not on file  Social History Narrative  . Not on file        Objective:  Physical Exam: BP 124/82   Pulse 84   Temp (!) 97.2 F (36.2 C)   Ht _0  (1.956 m)   Wt 203 lb 4 oz (92.2 kg)   SpO2 99%   BMI 24.10 kg/m   Body mass index is 24.1 kg/m. Wt Readings from Last 3 Encounters:  05/31/19 203 lb 4 oz (92.2 kg)  11/18/13 235 lb (106.6 kg)   Gen: NAD, resting comfortably HEENT: TMs normal bilaterally. OP clear. No thyromegaly noted.  CV: RRR with no murmurs appreciated Pulm: NWOB, CTAB with no crackles, wheezes, or rhonchi GI: Normal bowel sounds present. Soft, Nontender, Nondistended. MSK: no edema, cyanosis, or clubbing noted Skin: warm, dry Neuro: CN2-12 grossly intact. Strength 5/5 in upper and lower extremities. Reflexes symmetric and intact bilaterally.  Psych: Normal affect and thought content     Sebasthian Stailey M. Jerline Pain, MD 05/31/2019 9:52 AM

## 2019-05-31 NOTE — Assessment & Plan Note (Signed)
Continue Viagra 100 mg daily as needed

## 2019-05-31 NOTE — Patient Instructions (Signed)
It was very nice to see you today!  Keep up the good work!  We will check blood work today.  We will get your tetanus vaccine today.  Come back to see me in 1 year for your next physical, or sooner if needed.  Take care, Dr Jerline Pain  Please try these tips to maintain a healthy lifestyle:   Eat at least 3 REAL meals and 1-2 snacks per day.  Aim for no more than 5 hours between eating.  If you eat breakfast, please do so within one hour of getting up.    Obtain twice as many fruits/vegetables as protein or carbohydrate foods for both lunch and dinner. (Half of each meal should be fruits/vegetables, one quarter protein, and one quarter starchy carbs)   Cut down on sweet beverages. This includes juice, soda, and sweet tea.    Exercise at least 150 minutes every week.    Preventive Care 49-51 Years Old, Male Preventive care refers to lifestyle choices and visits with your health care provider that can promote health and wellness. This includes:  A yearly physical exam. This is also called an annual well check.  Regular dental and eye exams.  Immunizations.  Screening for certain conditions.  Healthy lifestyle choices, such as eating a healthy diet, getting regular exercise, not using drugs or products that contain nicotine and tobacco, and limiting alcohol use. What can I expect for my preventive care visit? Physical exam Your health care provider will check:  Height and weight. These may be used to calculate body mass index (BMI), which is a measurement that tells if you are at a healthy weight.  Heart rate and blood pressure.  Your skin for abnormal spots. Counseling Your health care provider may ask you questions about:  Alcohol, tobacco, and drug use.  Emotional well-being.  Home and relationship well-being.  Sexual activity.  Eating habits.  Work and work Statistician. What immunizations do I need?  Influenza (flu) vaccine  This is recommended every  year. Tetanus, diphtheria, and pertussis (Tdap) vaccine  You may need a Td booster every 10 years. Varicella (chickenpox) vaccine  You may need this vaccine if you have not already been vaccinated. Zoster (shingles) vaccine  You may need this after age 56. Measles, mumps, and rubella (MMR) vaccine  You may need at least one dose of MMR if you were born in 1957 or later. You may also need a second dose. Pneumococcal conjugate (PCV13) vaccine  You may need this if you have certain conditions and were not previously vaccinated. Pneumococcal polysaccharide (PPSV23) vaccine  You may need one or two doses if you smoke cigarettes or if you have certain conditions. Meningococcal conjugate (MenACWY) vaccine  You may need this if you have certain conditions. Hepatitis A vaccine  You may need this if you have certain conditions or if you travel or work in places where you may be exposed to hepatitis A. Hepatitis B vaccine  You may need this if you have certain conditions or if you travel or work in places where you may be exposed to hepatitis B. Haemophilus influenzae type b (Hib) vaccine  You may need this if you have certain risk factors. Human papillomavirus (HPV) vaccine  If recommended by your health care provider, you may need three doses over 6 months. You may receive vaccines as individual doses or as more than one vaccine together in one shot (combination vaccines). Talk with your health care provider about the risks and benefits of  combination vaccines. What tests do I need? Blood tests  Lipid and cholesterol levels. These may be checked every 5 years, or more frequently if you are over 22 years old.  Hepatitis C test.  Hepatitis B test. Screening  Lung cancer screening. You may have this screening every year starting at age 38 if you have a 30-pack-year history of smoking and currently smoke or have quit within the past 15 years.  Prostate cancer screening.  Recommendations will vary depending on your family history and other risks.  Colorectal cancer screening. All adults should have this screening starting at age 8 and continuing until age 68. Your health care provider may recommend screening at age 55 if you are at increased risk. You will have tests every 1-10 years, depending on your results and the type of screening test.  Diabetes screening. This is done by checking your blood sugar (glucose) after you have not eaten for a while (fasting). You may have this done every 1-3 years.  Sexually transmitted disease (STD) testing. Follow these instructions at home: Eating and drinking  Eat a diet that includes fresh fruits and vegetables, whole grains, lean protein, and low-fat dairy products.  Take vitamin and mineral supplements as recommended by your health care provider.  Do not drink alcohol if your health care provider tells you not to drink.  If you drink alcohol: ? Limit how much you have to 0-2 drinks a day. ? Be aware of how much alcohol is in your drink. In the [place] one drink equals one 12 oz bottle of beer (355 mL), one 5 oz glass of wine (148 mL), or one 1 oz glass of hard liquor (44 mL). Lifestyle  Take daily care of your teeth and gums.  Stay active. Exercise for at least 30 minutes on 5 or more days each week.  Do not use any products that contain nicotine or tobacco, such as cigarettes, e-cigarettes, and chewing tobacco. If you need help quitting, ask your health care provider.  If you are sexually active, practice safe sex. Use a condom or other form of protection to prevent STIs (sexually transmitted infections).  Talk with your health care provider about taking a low-dose aspirin every day starting at age 54. What's next?  Go to your health care provider once a year for a well check visit.  Ask your health care provider how often you should have your eyes and teeth checked.  Stay up to date on all vaccines. This  information is not intended to replace advice given to you by your health care provider. Make sure you discuss any questions you have with your health care provider. Document Released: 09/18/2015 Document Revised: 08/16/2018 Document Reviewed: 08/16/2018 Elsevier Patient Education  2020 [employer].

## 2019-05-31 NOTE — Assessment & Plan Note (Signed)
Continue Strattera per attention specialist.

## 2019-05-31 NOTE — Assessment & Plan Note (Signed)
Continue lifestyle modifications.  Check CBC, C met, TSH, and lipid panel. 

## 2019-05-31 NOTE — Assessment & Plan Note (Signed)
Check A1c. 

## 2019-06-05 ENCOUNTER — Telehealth: Payer: Self-pay | Admitting: Family Medicine

## 2019-06-05 NOTE — Telephone Encounter (Signed)
See note  Copied from [place] [id number]. Topic: Quick Communication - Lab Results (Clinic Use ONLY) >> Jun 05, 2019 11:37 AM Scherrie Gerlach wrote: Pt would like results of labs done Friday

## 2019-06-05 NOTE — Progress Notes (Signed)
Please inform patient of the following:  Cholesterol is borderline but everything else is NORMAL. Do not need to start medications. He should keep up the good work with diet and exercise and we can recheck in a year.  David Huang. Jerline Pain, MD 06/05/2019 2:42 PM

## 2019-06-06 ENCOUNTER — Telehealth: Payer: Self-pay | Admitting: Family Medicine

## 2019-06-06 NOTE — Telephone Encounter (Signed)
Called left voice message for patient to call clinic

## 2019-06-06 NOTE — Telephone Encounter (Signed)
Result note read to pt; verbalizes understanding.  TE as route note not routed to [facility].

## 2019-12-12 ENCOUNTER — Ambulatory Visit: Payer: BC Managed Care – PPO | Admitting: Family Medicine

## 2020-01-21 ENCOUNTER — Ambulatory Visit: Payer: BC Managed Care – PPO | Admitting: Family Medicine

## 2020-01-21 ENCOUNTER — Encounter: Payer: Self-pay | Admitting: Family Medicine

## 2020-01-21 ENCOUNTER — Other Ambulatory Visit: Payer: Self-pay

## 2020-01-21 VITALS — BP 110/72 | HR 106 | Temp 98.2°F | Ht 77.0 in | Wt 208.2 lb

## 2020-01-21 DIAGNOSIS — R5382 Chronic fatigue, unspecified: Secondary | ICD-10-CM

## 2020-01-21 DIAGNOSIS — R809 Proteinuria, unspecified: Secondary | ICD-10-CM

## 2020-01-21 DIAGNOSIS — L918 Other hypertrophic disorders of the skin: Secondary | ICD-10-CM

## 2020-01-21 DIAGNOSIS — R7303 Prediabetes: Secondary | ICD-10-CM

## 2020-01-21 DIAGNOSIS — R350 Frequency of micturition: Secondary | ICD-10-CM | POA: Diagnosis not present

## 2020-01-21 DIAGNOSIS — G4733 Obstructive sleep apnea (adult) (pediatric): Secondary | ICD-10-CM | POA: Insufficient documentation

## 2020-01-21 DIAGNOSIS — R319 Hematuria, unspecified: Secondary | ICD-10-CM

## 2020-01-21 LAB — POCT URINALYSIS DIPSTICK
Bilirubin, UA: NEGATIVE
Blood, UA: POSITIVE
Glucose, UA: NEGATIVE
Ketones, UA: NEGATIVE
Leukocytes, UA: NEGATIVE
Nitrite, UA: NEGATIVE
Protein, UA: POSITIVE — AB
Spec Grav, UA: 1.025 (ref 1.010–1.025)
Urobilinogen, UA: 1 E.U./dL
pH, UA: 6 (ref 5.0–8.0)

## 2020-01-21 NOTE — Progress Notes (Signed)
   David Huang is a 49 y.o. male who presents today for an office visit.  Assessment/Plan:  New/Acute Problems: Inflamed skin tag Removal performed today.  Tolerated well.  See below procedure note.  Fatigue/hematuria/proteinuria Unclear etiology for fatigue though likely related to untreated sleep apnea.  He does have some protein and blood on his urine dipstick today.  We will send out for microscopy.  Also check CBC, C met, TSH.  If microscopy confirms RBCs will likely need referral back to urology.  Chronic Problems Addressed Today: OSA (obstructive sleep apnea) Likely contributing to fatigue.  Does not wish to wear CPAP at this time.  Prediabetes Recheck A1c.    Subjective:  HPI:  Patient with worsening fatigue for the past few months.  He has a history of sleep apnea but does not wear CPAP machine.  He has also had frequent urination.  Feels more tired in the afternoon.  Has lost about 7 pounds in the past few months though has been working out more.  No constipation or diarrhea.  No melena or hematochezia.  No hair or skin changes.  He hsa been taking NSAIDs due to arm pain  He also has a skin tag in his left axilla that he would like to have removed today.        Objective:  Physical Exam: BP 110/72 (BP Location: Left Arm, Patient Position: Sitting, Cuff Size: Normal)   Pulse (!) 106   Temp 98.2 F (36.8 C) (Temporal)   Ht _0  (1.956 m)   Wt 208 lb 3.2 oz (94.4 kg)   SpO2 99%   BMI 24.69 kg/m   Wt Readings from Last 3 Encounters:  01/21/20 208 lb 3.2 oz (94.4 kg)  05/31/19 203 lb 4 oz (92.2 kg)  11/18/13 235 lb (106.6 kg)   Gen: No acute distress, resting comfortably CV: Regular rate and rhythm with no murmurs appreciated Pulm: Normal work of breathing, clear to auscultation bilaterally with no crackles, wheezes, or rhonchi Skin: Inflamed pedunculated skin tag in left axilla. Neuro: Grossly normal, moves all extremities Psych: Normal affect and thought  content  Skin Tag Removal Procedure Note Diagnosis: inflamed skin tags Location: Left Axilla Informed Consent: Discussed risks (permanent scarring, infection, pain, bleeding, bruising, redness, and recurrence of the lesion) and benefits of the procedure, as well as the alternatives. He is aware that skin tags are benign lesions, and their removal is often not considered medically necessary. Informed consent was obtained. Preparation: The area was prepared in a standard fashion. Anesthesia: Lidocaine 1% without epinephrine Procedure Details: Iris scissors were used to perform sharp removal. Aluminum chloride was applied for hemostasis. Ointment and bandage were applied where needed. The patient tolerated the procedure well. Total number of lesions treated: 1 Plan: The patient was instructed on post-op care. Recommend OTC analgesia as needed for pain.       Algis Greenhouse. Jerline Pain, MD 01/21/2020 3:21 PM

## 2020-01-21 NOTE — Assessment & Plan Note (Signed)
Likely contributing to fatigue.  Does not wish to wear CPAP at this time.

## 2020-01-21 NOTE — Assessment & Plan Note (Signed)
Recheck A1c 

## 2020-01-21 NOTE — Patient Instructions (Signed)
It was very nice to see you today!  We will check blood work today.  We will also send off a urine specimen.  We removed the skin tag today.  We will call you with the blood work once it all comes back.  Take care, Dr [name]  Please try these tips to maintain a healthy lifestyle:   Eat at least 3 REAL meals and 1-2 snacks per day.  Aim for no more than 5 hours between eating.  If you eat breakfast, please do so within one hour of getting up.    Each meal should contain half fruits/vegetables, one quarter protein, and one quarter carbs (no bigger than a computer mouse)   Cut down on sweet beverages. This includes juice, soda, and sweet tea.     Drink at least 1 glass of water with each meal and aim for at least 8 glasses per day   Exercise at least 150 minutes every week.

## 2020-01-22 LAB — COMPREHENSIVE METABOLIC PANEL
ALT: 21 U/L (ref 0–53)
AST: 28 U/L (ref 0–37)
Albumin: 4.7 g/dL (ref 3.5–5.2)
Alkaline Phosphatase: 48 U/L (ref 39–117)
BUN: 19 mg/dL (ref 6–23)
CO2: 30 mEq/L (ref 19–32)
Calcium: 9.9 mg/dL (ref 8.4–10.5)
Chloride: 102 mEq/L (ref 96–112)
Creatinine, Ser: 1.37 mg/dL (ref 0.40–1.50)
GFR: 67.53 mL/min (ref >60.00)
Glucose, Bld: 105 mg/dL — ABNORMAL HIGH (ref 70–99)
Potassium: 4.6 mEq/L (ref 3.5–5.1)
Sodium: 138 mEq/L (ref 135–145)
Total Bilirubin: 0.3 mg/dL (ref 0.2–1.2)
Total Protein: 7.1 g/dL (ref 6.0–8.3)

## 2020-01-22 LAB — CBC
HCT: 39.6 % (ref 39.0–52.0)
Hemoglobin: 13.7 g/dL (ref 13.0–17.0)
MCHC: 34.6 g/dL (ref 30.0–36.0)
MCV: 97.3 fl (ref 78.0–100.0)
Platelets: 210 10*3/uL (ref 150.0–400.0)
RBC: 4.07 Mil/uL — ABNORMAL LOW (ref 4.22–5.81)
RDW: 14.1 % (ref 11.5–15.5)
WBC: 5.6 10*3/uL (ref 4.0–10.5)

## 2020-01-22 LAB — URINALYSIS, ROUTINE W REFLEX MICROSCOPIC
Bilirubin Urine: NEGATIVE
Hgb urine dipstick: NEGATIVE
Ketones, ur: NEGATIVE
Leukocytes,Ua: NEGATIVE
Nitrite: NEGATIVE
Specific Gravity, Urine: 1.02 (ref 1.000–1.030)
Total Protein, Urine: NEGATIVE
Urine Glucose: NEGATIVE
Urobilinogen, UA: 1 (ref 0.0–1.0)
pH: 7 (ref 5.0–8.0)

## 2020-01-22 LAB — VITAMIN B12: Vitamin B-12: 1029 pg/mL — ABNORMAL HIGH (ref 211–911)

## 2020-01-22 LAB — HEMOGLOBIN A1C: Hgb A1c MFr Bld: 6 % (ref 4.6–6.5)

## 2020-01-22 LAB — TSH: TSH: 0.68 u[IU]/mL (ref 0.35–4.50)

## 2020-01-23 NOTE — Progress Notes (Signed)
Please inform patient of the following:  His blood sugar is up slightly into the prediabetic range but everything else is NORMAL. No clear explanation of his fatigue. His urine sample is NORMAL - no signs of protein or urine. I think the result we received in the office was a false positive.  All of his blood tests and urine sample are reassuring - I think his poor sleep is the main contributor to his fatigue. His increased urination is probably due to an enlarged prostate and his blood sugars increasing slightly.   Do not need to do anything else at this time. Would like for him to keep working on diet and exercise. We can recheck his blood sugar at his next physical.

## 2020-06-02 ENCOUNTER — Other Ambulatory Visit: Payer: Self-pay | Admitting: *Deleted

## 2020-06-02 MED ORDER — SILDENAFIL CITRATE 100 MG PO TABS: 100.0000 mg | ORAL_TABLET | Freq: Every day | ORAL | 0 refills | Status: DC | PRN

## 2020-06-29 ENCOUNTER — Other Ambulatory Visit: Payer: Self-pay

## 2020-06-29 ENCOUNTER — Encounter: Payer: Self-pay | Admitting: Family Medicine

## 2020-06-29 ENCOUNTER — Other Ambulatory Visit: Payer: Self-pay | Admitting: Family Medicine

## 2020-06-29 ENCOUNTER — Ambulatory Visit (INDEPENDENT_AMBULATORY_CARE_PROVIDER_SITE_OTHER): Payer: BC Managed Care – PPO | Admitting: Family Medicine

## 2020-06-29 VITALS — BP 131/72 | HR 92 | Temp 97.8°F | Ht 77.0 in | Wt 204.2 lb

## 2020-06-29 DIAGNOSIS — L709 Acne, unspecified: Secondary | ICD-10-CM | POA: Diagnosis not present

## 2020-06-29 DIAGNOSIS — R7303 Prediabetes: Secondary | ICD-10-CM | POA: Diagnosis not present

## 2020-06-29 DIAGNOSIS — N529 Male erectile dysfunction, unspecified: Secondary | ICD-10-CM | POA: Diagnosis not present

## 2020-06-29 LAB — CBC: RBC: 4.33 10*6/uL (ref 4.20–5.80)

## 2020-06-29 NOTE — Patient Instructions (Signed)
It was very nice to see you today!  We will check blood work today.  Please use a lotion that is "non comedogenic."  You can also use any acne facial on your back. Please use a product that has salicylic acid or benzoyl peroxide  Take care, Dr [name]  Please try these tips to maintain a healthy lifestyle:   Eat at least 3 REAL meals and 1-2 snacks per day.  Aim for no more than 5 hours between eating.  If you eat breakfast, please do so within one hour of getting up.    Each meal should contain half fruits/vegetables, one quarter protein, and one quarter carbs (no bigger than a computer mouse)   Cut down on sweet beverages. This includes juice, soda, and sweet tea.     Drink at least 1 glass of water with each meal and aim for at least 8 glasses per day   Exercise at least 150 minutes every week.

## 2020-06-29 NOTE — Progress Notes (Signed)
   Suhan Paci is a 49 y.o. male who presents today for an office visit.  Assessment/Plan:  Chronic Problems Addressed Today: Acne Recommended use of non-comedogenic products.  Also recommended use use topical benzoyl peroxide or salicylic acid as well.  Erectile dysfunction Stable.  Continue Viagra as needed.  Prediabetes Check A1c, CBC, CMET.  Continue lifestyle modifications.     Subjective:  HPI:  See A/p        Objective:  Physical Exam: BP 131/72   Pulse 92   Temp 97.8 F (36.6 C) (Temporal)   Ht 6\' 5"  (1.956 m)   Wt 204 lb 3.2 oz (92.6 kg)   SpO2 97%   BMI 24.21 kg/m   Gen: No acute distress, resting comfortably Neuro: Grossly normal, moves all extremities Psych: Normal affect and thought content      Westley Blass M. , MD 06/29/2020 9:15 AM

## 2020-06-29 NOTE — Assessment & Plan Note (Signed)
Stable.  Continue Viagra as needed. 

## 2020-06-29 NOTE — Assessment & Plan Note (Signed)
Check A1c, CBC, CMET.  Continue lifestyle modifications.

## 2020-06-29 NOTE — Assessment & Plan Note (Signed)
Recommended use of non-comedogenic products.  Also recommended use use topical benzoyl peroxide or salicylic acid as well.

## 2020-06-30 LAB — COMPREHENSIVE METABOLIC PANEL
AG Ratio: 1.8 (calc) (ref 1.0–2.5)
ALT: 15 U/L (ref 9–46)
AST: 25 U/L (ref 10–40)
Albumin: 4.5 g/dL (ref 3.6–5.1)
Alkaline phosphatase (APISO): 45 U/L (ref 36–130)
BUN: 16 mg/dL (ref 7–25)
CO2: 27 mmol/L (ref 20–32)
Calcium: 9.5 mg/dL (ref 8.6–10.3)
Chloride: 104 mmol/L (ref 98–110)
Creat: 1.3 mg/dL (ref 0.60–1.35)
Globulin: 2.5 g/dL (calc) (ref 1.9–3.7)
Glucose, Bld: 67 mg/dL (ref 65–99)
Potassium: 4.3 mmol/L (ref 3.5–5.3)
Sodium: 139 mmol/L (ref 135–146)
Total Bilirubin: 0.4 mg/dL (ref 0.2–1.2)
Total Protein: 7 g/dL (ref 6.1–8.1)

## 2020-06-30 LAB — CBC
HCT: 41.9 % (ref 38.5–50.0)
Hemoglobin: 14.3 g/dL (ref 13.2–17.1)
MCH: 33 pg (ref 27.0–33.0)
MCHC: 34.1 g/dL (ref 32.0–36.0)
MCV: 96.8 fL (ref 80.0–100.0)
MPV: 9.6 fL (ref 7.5–12.5)
Platelets: 217 10*3/uL (ref 140–400)
RDW: 13.2 % (ref 11.0–15.0)
WBC: 4.1 10*3/uL (ref 3.8–10.8)

## 2020-06-30 LAB — HEMOGLOBIN A1C
Hgb A1c MFr Bld: 5.7 % of total Hgb — ABNORMAL HIGH (ref <5.7)
Mean Plasma Glucose: 117 (calc)
eAG (mmol/L): 6.5 (calc)

## 2020-06-30 NOTE — Progress Notes (Signed)
Please inform patient of the following:  Blood sugar is better than last year. Everything else is stable. Would like for him to keep up the good work and we can recheck in 6-12 months.  Katina Degree. [name], MD 06/30/2020 12:51 PM

## 2020-07-14 ENCOUNTER — Telehealth: Payer: Self-pay

## 2020-07-14 ENCOUNTER — Other Ambulatory Visit: Payer: Self-pay | Admitting: Family Medicine

## 2020-07-14 MED ORDER — SILDENAFIL CITRATE 100 MG PO TABS: 100.0000 mg | ORAL_TABLET | Freq: Every day | ORAL | 2 refills | Status: DC | PRN

## 2020-07-14 NOTE — Telephone Encounter (Signed)
..   LAST APPOINTMENT DATE: 07/14/2020   NEXT APPOINTMENT DATE:@Visit  date not found  MEDICATION:sildenafil (VIAGRA) 100 MG tablet    PHARMACY:[facility] [address] [place] - [address]  *

## 2020-07-14 NOTE — Telephone Encounter (Signed)
Rx sent 

## 2020-10-20 ENCOUNTER — Encounter: Payer: Self-pay | Admitting: Family Medicine

## 2020-10-20 ENCOUNTER — Ambulatory Visit (INDEPENDENT_AMBULATORY_CARE_PROVIDER_SITE_OTHER): Payer: BC Managed Care – PPO | Admitting: Family Medicine

## 2020-10-20 ENCOUNTER — Other Ambulatory Visit: Payer: Self-pay

## 2020-10-20 VITALS — BP 127/77 | HR 94 | Temp 98.0°F | Ht 77.0 in | Wt 204.0 lb

## 2020-10-20 DIAGNOSIS — R55 Syncope and collapse: Secondary | ICD-10-CM | POA: Diagnosis not present

## 2020-10-20 NOTE — Progress Notes (Addendum)
   David Huang is a 50 y.o. male who presents today for an office visit.  Assessment/Plan:  Syncope Concern for cardiac etiology or malrrhythmia given lack of warning signs or preceding symptoms.  History not consistent with orthostatic hypotension.  No seizure-like activity.  Had labs done a couple months ago all of which were normal.  EKG today is normal however will likely need prolonged monitoring.  Will place referral to cardiology for further evaluation and management.    Subjective:  HPI:  Patient here with 5 or 6 syncopal episodes over the last 6 months.  Last episode was about 2 weeks ago.  Symptoms have all been similar nature.  They occur randomly without warning.  Will feel shortness of breath.  Occasionally has sensation of palpitation in his chest he will then lose consciousness.  He will regain consciousness after a few minutes of laying on the floor.  Most recently this happened while he was in the garage and while he was in the pantry.  No chest pain.  No other weakness or numbness.  Otherwise feels like his normal self.  No recent illnesses.  No family history of cardiac illness. No seizure like activity.        Objective:  Physical Exam: Temp 98 F (36.7 C) (Temporal)   Ht 6\' 5"  (1.956 m)   Wt 204 lb (92.5 kg)   BMI 24.19 kg/m   Gen: No acute distress, resting comfortably CV: Regular rate and rhythm with no murmurs appreciated.  No carotid bruits. Pulm: Normal work of breathing, clear to auscultation bilaterally with no crackles, wheezes, or rhonchi Neuro: Grossly normal, moves all extremities Psych: Normal affect and thought content  EKG: NSR. No ischemic changes.       Time Spent: 25 minutes of total time was spent on the date of the encounter performing the following actions: chart review prior to seeing the patient, obtaining history, performing a medically necessary exam, counseling on the treatment plan including need for referral, placing orders, and  documenting in our EHR.    . Katina Degree, MD 10/20/2020 2:31 PM

## 2020-10-20 NOTE — Patient Instructions (Signed)
It was very nice to see you today!  We need to get you into see a cardiologist.  Your EKG today is normal however I am concerned that you may be having abnormal heart rhythms that are causing your symptoms.  Please let us know if your symptoms change in any way.  Take care, Dr [name]  Please try these tips to maintain a healthy lifestyle:   Eat at least 3 REAL meals and 1-2 snacks per day.  Aim for no more than 5 hours between eating.  If you eat breakfast, please do so within one hour of getting up.    Each meal should contain half fruits/vegetables, one quarter protein, and one quarter carbs (no bigger than a computer mouse)   Cut down on sweet beverages. This includes juice, soda, and sweet tea.     Drink at least 1 glass of water with each meal and aim for at least 8 glasses per day   Exercise at least 150 minutes every week.

## 2020-11-13 ENCOUNTER — Ambulatory Visit: Payer: BC Managed Care – PPO | Admitting: Cardiology

## 2020-11-13 ENCOUNTER — Other Ambulatory Visit: Payer: Self-pay

## 2020-11-13 ENCOUNTER — Ambulatory Visit: Payer: BC Managed Care – PPO

## 2020-11-13 ENCOUNTER — Encounter: Payer: Self-pay | Admitting: Cardiology

## 2020-11-13 VITALS — BP 118/82 | HR 77 | Ht 77.0 in | Wt 207.8 lb

## 2020-11-13 DIAGNOSIS — R55 Syncope and collapse: Secondary | ICD-10-CM | POA: Diagnosis not present

## 2020-11-13 NOTE — Patient Instructions (Signed)
Medication Instructions:  Your physician recommends that you continue on your current medications as directed. Please refer to the Current Medication list given to you today.  Testing/Procedures: Your physician has requested that you have an echocardiogram. Echocardiography is a painless test that uses sound waves to create images of your heart. It provides your doctor with information about the size and shape of your heart and how well your heart's chambers and valves are working. This procedure takes approximately one hour. There are no restrictions for this procedure.  This will be done at our [address] location:  [address]   ZIO XT- Long Term Monitor Instructions   Your physician has requested you wear your ZIO patch monitor___14___days.   This is a single patch monitor.  [employer] supplies one patch monitor per enrollment.  Additional stickers are not available.   Please do not apply patch if you will be having a Nuclear Stress Test, Echocardiogram, Cardiac CT, MRI, or Chest Xray during the time frame you would be wearing the monitor. The patch cannot be worn during these tests.  You cannot remove and re-apply the ZIO XT patch monitor.   Your ZIO patch monitor will be sent USPS Priority mail from [employer] directly to your home address. The monitor may also be mailed to a PO BOX if home delivery is not available.   It may take 3-5 days to receive your monitor after you have been enrolled.   Once you have received you monitor, please review enclosed instructions.  Your monitor has already been registered assigning a specific monitor serial # to you.   Applying the monitor   Shave hair from upper left chest.   Hold abrader disc by orange tab.  Rub abrader in 40 strokes over left upper chest as indicated in your monitor instructions.   Clean area with 4 enclosed alcohol pads .  Use all pads to assure are is cleaned thoroughly.  Let dry.   Apply patch  as indicated in monitor instructions.  Patch will be place under collarbone on left side of chest with arrow pointing upward.   Rub patch adhesive wings for 2 minutes.Remove white label marked "1".  Remove white label marked "2".  Rub patch adhesive wings for 2 additional minutes.   While looking in a mirror, press and release button in center of patch.  A small green light will flash 3-4 times .  This will be your only indicator the monitor has been turned on.     Do not shower for the first 24 hours.  You may shower after the first 24 hours.   Press button if you feel a symptom. You will hear a small click.  Record Date, Time and Symptom in the Patient Log Book.   When you are ready to remove patch, follow instructions on last 2 pages of Patient Log Book.  Stick patch monitor onto last page of Patient Log Book.   Place Patient Log Book in Blue box.  Use locking tab on box and tape box closed securely.  The Orange and White box has prepaid postage on it.  Please place in mailbox as soon as possible.  Your physician should have your test results approximately 7 days after the monitor has been mailed back to [employer].   Call [employer] Customer Care at [phone] if you have questions regarding your ZIO XT patch monitor.  Call them immediately if you see an orange light blinking on your   monitor.   If your monitor falls off in less than 4 days contact our Monitor department at [phone].  If your monitor becomes loose or falls off after 4 days call [employer] at [phone] for suggestions on securing your monitor.    Follow-Up: At [facility], you and your health needs are our priority.  As part of our continuing mission to provide you with exceptional heart care, we have created designated Provider Care Teams.  These Care Teams include your primary Cardiologist (physician) and Advanced Practice Providers (APPs -  Physician Assistants and Nurse Practitioners) who all work  together to provide you with the care you need, when you need it.  We recommend signing up for the patient portal called "MyChart".  Sign up information is provided on this After Visit Summary.  MyChart is used to connect with patients for Virtual Visits (Telemedicine).  Patients are able to view lab/test results, encounter notes, upcoming appointments, etc.  Non-urgent messages can be sent to your provider as well.   To learn more about what you can do with MyChart, go to [email or web address].    Your next appointment:   3 month(s)  The format for your next appointment:   In Person  Provider:   Christopher Schumann, MD    

## 2020-11-13 NOTE — Progress Notes (Signed)
Cardiology Office Note:    Date:  11/15/2020   ID:  David Huang, DOB Sep 07, 1970, MRN [id number]  PCP:  Ardith Dark, MD  Cardiologist:  No primary care provider on file.  Electrophysiologist:  None   Referring MD: Ardith Dark, MD   Chief Complaint  Patient presents with  . Loss of Consciousness    History of Present Illness:    David Huang is a 50 y.o. male with a hx of ADHD who is referred by Dr. [name] for evaluation of syncope.  He reports he has had 4-5 episodes of syncope or near syncope in the past year.  Most recent was about 1 month ago he was in his garage and started to get lightheaded, then felt out of breath, and then passed out.  Reports he had been sitting down and stood up and started to walk into the room and within 10 seconds he passed out.  States he thinks he was out for a few minutes.  Episode before this occurred when he was standing in his pantry and started to feel tired and lightheaded.  Felt he was going to pass out so laid on the ground and did not lose consciousness.  Reports he is very active, does free weights and cardio at the gym for about an hour.  He played college basketball at [employer].  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  He denies any chest pain or shortness of breath.  Reports rare palpitations that can last for up to 20 seconds.  He has noted palpitations during some of his syncopal episodes.  Wife reports he snores.  No smoking history.  No history of heart disease in his immediate family.  Past Medical History:  Diagnosis Date  . Fainting spell   . Hyperlipidemia     Past Surgical History:  Procedure Laterality Date  . ANKLE SURGERY Left   . BACK SURGERY    . KNEE ARTHROSCOPY WITH MENISCAL REPAIR    . KNEE SURGERY Left   . SHOULDER ARTHROSCOPY WITH LABRAL REPAIR      Current Medications: Current Meds  Medication Sig  . atomoxetine (STRATTERA) 80 MG capsule 80 mg daily.  . Multiple Vitamin (MULTI VITAMIN  DAILY PO) Take 2 tablets by mouth daily.  . sildenafil (VIAGRA) 100 MG tablet Take 1 tablet (100 mg total) by mouth daily as needed.     Allergies:   Patient has no known allergies.   Social History   Socioeconomic History  . Marital status: Married    Spouse name: Not on file  . Number of children: Not on file  . Years of education: Not on file  . Highest education level: Not on file  Occupational History  . Not on file  Tobacco Use  . Smoking status: Never Smoker  . Smokeless tobacco: Never Used  Vaping Use  . Vaping Use: Never used  Substance and Sexual Activity  . Alcohol use: Yes    Comment: occasional  . Drug use: No  . Sexual activity: Not on file  Other Topics Concern  . Not on file  Social History Narrative  . Not on file   Social Determinants of Health   Financial Resource Strain: Not on file  Food Insecurity: Not on file  Transportation Needs: Not on file  Physical Activity: Not on file  Stress: Not on file  Social Connections: Not on file     Family History: The patient'sfamily history includes Cancer in his  father and paternal aunt; Liver cancer in his father. There is no history of Prostate cancer or Colon cancer.  ROS:   Please see the history of present illness.    All other systems reviewed and are negative.  EKGs/Labs/Other Studies Reviewed:    The following studies were reviewed today:   EKG:  EKG is ordered today.  The ekg ordered today demonstrates normal sinus rhythm, rate 77, no ST/T abnormalities, QTC 414  Recent Labs: 01/21/2020: TSH 0.68 06/29/2020: ALT 15; BUN 16; Creat 1.30; Hemoglobin 14.3; Platelets 217; Potassium 4.3; Sodium 139  Recent Lipid Panel    Component Value Date/Time   CHOL 202 (H) 05/31/2019 0953   TRIG 49.0 05/31/2019 0953   HDL 65.80 05/31/2019 0953   CHOLHDL 3 05/31/2019 0953   VLDL 9.8 05/31/2019 0953   LDLCALC 127 (H) 05/31/2019 0953    Physical Exam:    VS:  BP 118/82   Pulse 77   Ht 6\' 5"  (1.956  m)   Wt 207 lb 12.8 oz (94.3 kg)   BMI 24.64 kg/m     Wt Readings from Last 3 Encounters:  11/13/20 207 lb 12.8 oz (94.3 kg)  10/20/20 204 lb (92.5 kg)  06/29/20 204 lb 3.2 oz (92.6 kg)     GEN: Well nourished, well developed in no acute distress HEENT: Normal NECK: No JVD; No carotid bruits LYMPHATICS: No lymphadenopathy CARDIAC: RRR, no murmurs, rubs, gallops RESPIRATORY:  Clear to auscultation without rales, wheezing or rhonchi  ABDOMEN: Soft, non-tender, non-distended MUSCULOSKELETAL:  No edema; No deformity  SKIN: Warm and dry NEUROLOGIC:  Alert and oriented x 3 PSYCHIATRIC:  Normal affect   ASSESSMENT:    1. Syncope, unspecified syncope type    PLAN:    Syncope: Unclear cause.  Episodes appear to come on fairly suddenly but does have some prodromal symptoms prior to passing out.  Could represent vasovagal syncope.  Unclear triggers for this.  Orthostatics unremarkable in clinic today.  Recommend echocardiogram to rule out structural heart disease.  Will check Zio patch x14 days to evaluate for arrhythmias.  Advised that should he have any prodromal symptoms such as lightheadedness or blurred vision, then should lie on the ground to prevent syncopal episode  RTC in 3 months  Medication Adjustments/Labs and Tests Ordered: Current medicines are reviewed at length with the patient today.  Concerns regarding medicines are outlined above.  Orders Placed This Encounter  Procedures  . LONG TERM MONITOR (3-14 DAYS)  . EKG 12-Lead  . ECHOCARDIOGRAM COMPLETE   No orders of the defined types were placed in this encounter.   Patient Instructions  Medication Instructions:  Your physician recommends that you continue on your current medications as directed. Please refer to the Current Medication list given to you today.  Testing/Procedures: Your physician has requested that you have an echocardiogram. Echocardiography is a painless test that uses sound waves to create images  of your heart. It provides your doctor with information about the size and shape of your heart and how well your heart's chambers and valves are working. This procedure takes approximately one hour. There are no restrictions for this procedure.  This will be done at our [facility] location:  [address]   ZIO XT- Long Term Monitor Instructions   Your physician has requested you wear your ZIO patch monitor 14 days.   This is a single patch monitor.  [employer] supplies one patch monitor per enrollment.  Additional stickers are  not available.   Please do not apply patch if you will be having a Nuclear Stress Test, Echocardiogram, Cardiac CT, MRI, or Chest Xray during the time frame you would be wearing the monitor. The patch cannot be worn during these tests.  You cannot remove and re-apply the ZIO XT patch monitor.   Your ZIO patch monitor will be sent USPS Priority mail from [facility] directly to your home address. The monitor may also be mailed to a PO BOX if home delivery is not available.   It may take 3-5 days to receive your monitor after you have been enrolled.   Once you have received you monitor, please review enclosed instructions.  Your monitor has already been registered assigning a specific monitor serial # to you.   Applying the monitor   Shave hair from upper left chest.   Hold abrader disc by orange tab.  Rub abrader in 40 strokes over left upper chest as indicated in your monitor instructions.   Clean area with 4 enclosed alcohol pads .  Use all pads to assure are is cleaned thoroughly.  Let dry.   Apply patch as indicated in monitor instructions.  Patch will be place under collarbone on left side of chest with arrow pointing upward.   Rub patch adhesive wings for 2 minutes.Remove white label marked "1".  Remove white label marked "2".  Rub patch adhesive wings for 2 additional minutes.   While looking in a mirror, press and release button in  center of patch.  A small green light will flash 3-4 times .  This will be your only indicator the monitor has been turned on.     Do not shower for the first 24 hours.  You may shower after the first 24 hours.   Press button if you feel a symptom. You will hear a small click.  Record Date, Time and Symptom in the Patient Log Book.   When you are ready to remove patch, follow instructions on last 2 pages of Patient Log Book.  Stick patch monitor onto last page of Patient Log Book.   Place Patient Log Book in [place] box.  Use locking tab on box and tape box closed securely.  The Orange and [employer] box has [employer] postage on it.  Please place in mailbox as soon as possible.  Your physician should have your test results approximately 7 days after the monitor has been mailed back to [facility].   Call [facility] Customer Care at [phone] if you have questions regarding your ZIO XT patch monitor.  Call them immediately if you see an orange light blinking on your monitor.   If your monitor falls off in less than 4 days contact our Monitor department at [phone].  If your monitor becomes loose or falls off after 4 days call [employer] at [phone] for suggestions on securing your monitor.    Follow-Up: At [facility], you and your health needs are our priority.  As part of our continuing mission to provide you with exceptional heart care, we have created designated Provider Care Teams.  These Care Teams include your primary Cardiologist (physician) and Advanced Practice Providers (APPs -  Physician Assistants and Nurse Practitioners) who all work together to provide you with the care you need, when you need it.  We recommend signing up for the patient portal called "MyChart".  Sign up information is provided on this After Visit Summary.  MyChart is used to connect with patients for Virtual  Visits (Telemedicine).  Patients are able to view lab/test results, encounter notes, upcoming  appointments, etc.  Non-urgent messages can be sent to your provider as well.   To learn more about what you can do with MyChart, go to [email or web address].    Your next appointment:   3 month(s)  The format for your next appointment:   In Person  Provider:   Epifanio Lesches, MD        Signed, Little Ishikawa, MD  11/15/2020 2:45 PM    [facility]

## 2020-12-08 ENCOUNTER — Other Ambulatory Visit (HOSPITAL_COMMUNITY): Payer: BC Managed Care – PPO

## 2020-12-14 ENCOUNTER — Other Ambulatory Visit: Payer: Self-pay | Admitting: Family Medicine

## 2020-12-24 ENCOUNTER — Telehealth (HOSPITAL_COMMUNITY): Payer: Self-pay | Admitting: Cardiology

## 2020-12-24 NOTE — Telephone Encounter (Signed)
Patient called and cancelled echocardiogram for reason below:  12/15/20 Spoke with patient and he will call me back to reschedule @ 9:38/LBW  12/10/20 [facility] to schedule @ 10:33/LBW  12/08/20 Pt called abd cancelled echo same day at 2:29 and wants to reschedule. I called patient @ 2:30 and NVM set up/LBW  Order will be removed from the WQ and when patient calls back we will reinstate the order. Thank you

## 2021-02-14 NOTE — Progress Notes (Deleted)
Cardiology Office Note:    Date:  02/14/2021   ID:  David Huang, DOB 1971-02-14, MRN [id number]  PCP:  Ardith Dark, MD  Cardiologist:  None  Electrophysiologist:  None   Referring MD: Ardith Dark, MD   No chief complaint on file.   History of Present Illness:    David Huang is a 50 y.o. male with a hx of ADHD who presents for follow-up.  He was referred by Dr. [name] for evaluation of syncope, initially seen on 11/13/2020.  He reports he has had 4-5 episodes of syncope or near syncope in the past year.  Most recent was about 1 month ago he was in his garage and started to get lightheaded, then felt out of breath, and then passed out.  Reports he had been sitting down and stood up and started to walk into the room and within 10 seconds he passed out.  States he thinks he was out for a few minutes.  Episode before this occurred when he was standing in his pantry and started to feel tired and lightheaded.  Felt he was going to pass out so laid on the ground and did not lose consciousness.  Reports he is very active, does free weights and cardio at the gym for about an hour.  He played college basketball at [employer].  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  He denies any chest pain or shortness of breath.  Reports rare palpitations that can last for up to 20 seconds.  He has noted palpitations during some of his syncopal episodes.  Wife reports he snores.  No smoking history.  No history of heart disease in his immediate family.  At initial clinic visit on 11/13/2020, echocardiogram and Zio patch x14 days ordered, but have not been completed.  Past Medical History:  Diagnosis Date   Fainting spell    Hyperlipidemia     Past Surgical History:  Procedure Laterality Date   ANKLE SURGERY Left    BACK SURGERY     KNEE ARTHROSCOPY WITH MENISCAL REPAIR     KNEE SURGERY Left    SHOULDER ARTHROSCOPY WITH LABRAL REPAIR      Current Medications: No outpatient  medications have been marked as taking for the 02/16/21 encounter (Appointment) with Little Ishikawa, MD.     Allergies:   Patient has no known allergies.   Social History   Socioeconomic History   Marital status: Married    Spouse name: Not on file   Number of children: Not on file   Years of education: Not on file   Highest education level: Not on file  Occupational History   Not on file  Tobacco Use   Smoking status: Never   Smokeless tobacco: Never  Vaping Use   Vaping Use: Never used  Substance and Sexual Activity   Alcohol use: Yes    Comment: occasional   Drug use: No   Sexual activity: Not on file  Other Topics Concern   Not on file  Social History Narrative   Not on file   Social Determinants of Health   Financial Resource Strain: Not on file  Food Insecurity: Not on file  Transportation Needs: Not on file  Physical Activity: Not on file  Stress: Not on file  Social Connections: Not on file     Family History: The patient'sfamily history includes Cancer in his father and paternal aunt; Liver cancer in his father. There is no history of Prostate  cancer or Colon cancer.  ROS:   Please see the history of present illness.    All other systems reviewed and are negative.  EKGs/Labs/Other Studies Reviewed:    The following studies were reviewed today:   EKG:  EKG is ordered today.  The ekg ordered today demonstrates normal sinus rhythm, rate 77, no ST/T abnormalities, QTC 414  Recent Labs: 06/29/2020: ALT 15; BUN 16; Creat 1.30; Hemoglobin 14.3; Platelets 217; Potassium 4.3; Sodium 139  Recent Lipid Panel    Component Value Date/Time   CHOL 202 (H) 05/31/2019 0953   TRIG 49.0 05/31/2019 0953   HDL 65.80 05/31/2019 0953   CHOLHDL 3 05/31/2019 0953   VLDL 9.8 05/31/2019 0953   LDLCALC 127 (H) 05/31/2019 0953    Physical Exam:    VS:  There were no vitals taken for this visit.    Wt Readings from Last 3 Encounters:  11/13/20 207 lb 12.8 oz  (94.3 kg)  10/20/20 204 lb (92.5 kg)  06/29/20 204 lb 3.2 oz (92.6 kg)     GEN: Well nourished, well developed in no acute distress HEENT: Normal NECK: No JVD; No carotid bruits LYMPHATICS: No lymphadenopathy CARDIAC: RRR, no murmurs, rubs, gallops RESPIRATORY:  Clear to auscultation without rales, wheezing or rhonchi  ABDOMEN: Soft, non-tender, non-distended MUSCULOSKELETAL:  No edema; No deformity  SKIN: Warm and dry NEUROLOGIC:  Alert and oriented x 3 PSYCHIATRIC:  Normal affect   ASSESSMENT:    No diagnosis found.  PLAN:    Syncope: Unclear cause.  Episodes appear to come on fairly suddenly but does have some prodromal symptoms prior to passing out.  Could represent vasovagal syncope.  Unclear triggers for this.  Orthostatics unremarkable in clinic today.  Recommend echocardiogram to rule out structural heart disease.  Will check Zio patch x14 days to evaluate for arrhythmias.  Advised that should he have any prodromal symptoms such as lightheadedness or blurred vision, then should lie on the ground to prevent syncopal episode  RTC in***  Medication Adjustments/Labs and Tests Ordered: Current medicines are reviewed at length with the patient today.  Concerns regarding medicines are outlined above.  No orders of the defined types were placed in this encounter.  No orders of the defined types were placed in this encounter.   There are no Patient Instructions on file for this visit.   Signed, Little Ishikawa, MD  02/14/2021 3:17 PM     [facility]

## 2021-02-16 ENCOUNTER — Ambulatory Visit: Payer: BC Managed Care – PPO | Admitting: Cardiology

## 2021-04-16 ENCOUNTER — Other Ambulatory Visit: Payer: Self-pay | Admitting: Family Medicine

## 2021-04-16 ENCOUNTER — Telehealth: Payer: Self-pay

## 2021-04-16 NOTE — Telephone Encounter (Signed)
LAST APPOINTMENT DATE:  10/20/20  NEXT APPOINTMENT DATE:None   MEDICATION:sildenafil (VIAGRA) 100 MG tablet  PHARMACY:[facility] [id number] [place], [place] - [address]

## 2021-04-16 NOTE — Telephone Encounter (Signed)
Refill has been sent to pharmacy.  

## 2021-07-20 ENCOUNTER — Other Ambulatory Visit: Payer: Self-pay

## 2021-07-20 ENCOUNTER — Ambulatory Visit (INDEPENDENT_AMBULATORY_CARE_PROVIDER_SITE_OTHER): Payer: BC Managed Care – PPO | Admitting: Family Medicine

## 2021-07-20 ENCOUNTER — Encounter: Payer: Self-pay | Admitting: Family Medicine

## 2021-07-20 VITALS — BP 121/77 | HR 97 | Temp 98.0°F | Ht 77.0 in | Wt 218.0 lb

## 2021-07-20 DIAGNOSIS — Z125 Encounter for screening for malignant neoplasm of prostate: Secondary | ICD-10-CM

## 2021-07-20 DIAGNOSIS — Z0001 Encounter for general adult medical examination with abnormal findings: Secondary | ICD-10-CM

## 2021-07-20 DIAGNOSIS — N529 Male erectile dysfunction, unspecified: Secondary | ICD-10-CM

## 2021-07-20 DIAGNOSIS — F909 Attention-deficit hyperactivity disorder, unspecified type: Secondary | ICD-10-CM

## 2021-07-20 DIAGNOSIS — Z6825 Body mass index (BMI) 25.0-25.9, adult: Secondary | ICD-10-CM

## 2021-07-20 DIAGNOSIS — E785 Hyperlipidemia, unspecified: Secondary | ICD-10-CM

## 2021-07-20 DIAGNOSIS — R7303 Prediabetes: Secondary | ICD-10-CM | POA: Diagnosis not present

## 2021-07-20 DIAGNOSIS — E663 Overweight: Secondary | ICD-10-CM

## 2021-07-20 LAB — TSH: TSH: 0.4 u[IU]/mL (ref 0.35–5.50)

## 2021-07-20 LAB — CBC
HCT: 42.3 % (ref 39.0–52.0)
Hemoglobin: 13.9 g/dL (ref 13.0–17.0)
MCHC: 33 g/dL (ref 30.0–36.0)
MCV: 99.4 fl (ref 78.0–100.0)
Platelets: 195 10*3/uL (ref 150.0–400.0)
RBC: 4.26 Mil/uL (ref 4.22–5.81)
RDW: 14.6 % (ref 11.5–15.5)
WBC: 4.8 10*3/uL (ref 4.0–10.5)

## 2021-07-20 LAB — COMPREHENSIVE METABOLIC PANEL
ALT: 20 U/L (ref 0–53)
AST: 26 U/L (ref 0–37)
Albumin: 4.5 g/dL (ref 3.5–5.2)
Alkaline Phosphatase: 47 U/L (ref 39–117)
BUN: 19 mg/dL (ref 6–23)
CO2: 30 mEq/L (ref 19–32)
Calcium: 9.8 mg/dL (ref 8.4–10.5)
Chloride: 104 mEq/L (ref 96–112)
Creatinine, Ser: 1.47 mg/dL (ref 0.40–1.50)
GFR: 56.24 mL/min — ABNORMAL LOW (ref >60.00)
Glucose, Bld: 73 mg/dL (ref 70–99)
Potassium: 4.5 mEq/L (ref 3.5–5.1)
Sodium: 139 mEq/L (ref 135–145)
Total Bilirubin: 0.5 mg/dL (ref 0.2–1.2)
Total Protein: 7 g/dL (ref 6.0–8.3)

## 2021-07-20 LAB — PSA: PSA: 0.76 ng/mL (ref 0.10–4.00)

## 2021-07-20 LAB — HEMOGLOBIN A1C: Hgb A1c MFr Bld: 6.2 % (ref 4.6–6.5)

## 2021-07-20 NOTE — Assessment & Plan Note (Signed)
Check lipids. Discussed lifestyle modifications.  

## 2021-07-20 NOTE — Patient Instructions (Signed)
It was very nice to see you today!  We will check blood work today.  Please continue working on diet and exercise.  We will see back in 1 year for your next physical.  Come back sooner if needed.  Take care, Dr [name]  PLEASE NOTE:  If you had any lab tests please let us know if you have not heard back within a few days. You may see your results on mychart before we have a chance to review them but we will give you a call once they are reviewed by [place]. If we ordered any referrals today, please let us know if you have not heard from their office within the next week.   Please try these tips to maintain a healthy lifestyle:  Eat at least 3 REAL meals and 1-2 snacks per day.  Aim for no more than 5 hours between eating.  If you eat breakfast, please do so within one hour of getting up.   Each meal should contain half fruits/vegetables, one quarter protein, and one quarter carbs (no bigger than a computer mouse)  Cut down on sweet beverages. This includes juice, soda, and sweet tea.   Drink at least 1 glass of water with each meal and aim for at least 8 glasses per day  Exercise at least 150 minutes every week.    Preventive Care 65-24 Years Old, Male Preventive care refers to lifestyle choices and visits with your health care provider that can promote health and wellness. Preventive care visits are also called wellness exams. What can I expect for my preventive care visit? Counseling During your preventive care visit, your health care provider may ask about your: Medical history, including: Past medical problems. Family medical history. Current health, including: Emotional well-being. Home life and relationship well-being. Sexual activity. Lifestyle, including: Alcohol, nicotine or tobacco, and drug use. Access to firearms. Diet, exercise, and sleep habits. Safety issues such as seatbelt and bike helmet use. Sunscreen use. Work and work Astronomer. Physical exam Your  health care provider will check your: Height and weight. These may be used to calculate your BMI (body mass index). BMI is a measurement that tells if you are at a healthy weight. Waist circumference. This measures the distance around your waistline. This measurement also tells if you are at a healthy weight and may help predict your risk of certain diseases, such as type 2 diabetes and high blood pressure. Heart rate and blood pressure. Body temperature. Skin for abnormal spots. What immunizations do I need? Vaccines are usually given at various ages, according to a schedule. Your health care provider will recommend vaccines for you based on your age, medical history, and lifestyle or other factors, such as travel or where you work. What tests do I need? Screening Your health care provider may recommend screening tests for certain conditions. This may include: Lipid and cholesterol levels. Diabetes screening. This is done by checking your blood sugar (glucose) after you have not eaten for a while (fasting). Hepatitis B test. Hepatitis C test. HIV (human immunodeficiency virus) test. STI (sexually transmitted infection) testing, if you are at risk. Lung cancer screening. Prostate cancer screening. Colorectal cancer screening. Talk with your health care provider about your test results, treatment options, and if necessary, the need for more tests. Follow these instructions at home: Eating and drinking  Eat a diet that includes fresh fruits and vegetables, whole grains, lean protein, and low-fat dairy products. Take vitamin and mineral supplements as recommended by your  health care provider. Do not drink alcohol if your health care provider tells you not to drink. If you drink alcohol: Limit how much you have to 0-2 drinks a day. Know how much alcohol is in your drink. In the [place] one drink equals one 12 oz bottle of beer (355 mL), one 5 oz glass of wine (148 mL), or one 1 oz glass of  hard liquor (44 mL). Lifestyle Brush your teeth every morning and night with fluoride toothpaste. Floss one time each day. Exercise for at least 30 minutes 5 or more days each week. Do not use any products that contain nicotine or tobacco. These products include cigarettes, chewing tobacco, and vaping devices, such as e-cigarettes. If you need help quitting, ask your health care provider. Do not use drugs. If you are sexually active, practice safe sex. Use a condom or other form of protection to prevent STIs. Take aspirin only as told by your health care provider. Make sure that you understand how much to take and what form to take. Work with your health care provider to find out whether it is safe and beneficial for you to take aspirin daily. Find healthy ways to manage stress, such as: Meditation, yoga, or listening to music. Journaling. Talking to a trusted person. Spending time with friends and family. Minimize exposure to UV radiation to reduce your risk of skin cancer. Safety Always wear your seat belt while driving or riding in a vehicle. Do not drive: If you have been drinking alcohol. Do not ride with someone who has been drinking. When you are tired or distracted. While texting. If you have been using any mind-altering substances or drugs. Wear a helmet and other protective equipment during sports activities. If you have firearms in your house, make sure you follow all gun safety procedures. What's next? Go to your health care provider once a year for an annual wellness visit. Ask your health care provider how often you should have your eyes and teeth checked. Stay up to date on all vaccines. This information is not intended to replace advice given to you by your health care provider. Make sure you discuss any questions you have with your health care provider. Document Revised: 02/17/2021 Document Reviewed: 02/17/2021 Elsevier Patient Education  2022 [employer].

## 2021-07-20 NOTE — Progress Notes (Signed)
Chief Complaint:  David Huang is a 50 y.o. male who presents today for his annual comprehensive physical exam.    Assessment/Plan:  New/Acute Problems: Syncope He has not had any recurrent episodes since earlier this year.  Patient deferred further management for now.   Chronic Problems Addressed Today: Erectile dysfunction Stable on viagra as needed.   Attention deficit hyperactivity disorder (ADHD) Continue stattera per attention specialist   Dyslipidemia Check lipids.  Discussed lifestyle modifications.  Prediabetes Check A1c.  Body mass index is 25.85 kg/m. / Overweight  BMI Metric Follow Up - 07/20/21 1157       BMI Metric Follow Up-Please document annually   BMI Metric Follow Up Education provided              Preventative Healthcare: Will get blood work done today. Check PSA. Discussed cologuard. Declined flu vaccine.  He  will contact us soon regarding his preferred method for colon cancer screening.   Patient Counseling(The following topics were reviewed and/or handout was given):  -Nutrition: Stressed importance of moderation in sodium/caffeine intake, saturated fat and cholesterol, caloric balance, sufficient intake of fresh fruits, vegetables, and fiber.  -Stressed the importance of regular exercise.   -Substance Abuse: Discussed cessation/primary prevention of tobacco, alcohol, or other drug use; driving or other dangerous activities under the influence; availability of treatment for abuse.   -Injury prevention: Discussed safety belts, safety helmets, smoke detector, smoking near bedding or upholstery.   -Sexuality: Discussed sexually transmitted diseases, partner selection, use of condoms, avoidance of unintended pregnancy and contraceptive alternatives.   -Dental health: Discussed importance of regular tooth brushing, flossing, and dental visits.  -Health maintenance and immunizations reviewed. Please refer to Health maintenance section.  Return to  care in 1 year for next preventative visit.     Subjective:  HPI:  He has no acute complaints today.   Lifestyle Diet: Balanced. Exercise: Trying to exercise.  Depression screen PHQ 2/9 07/20/2021  Decreased Interest 0  Down, Depressed, Hopeless 0  PHQ - 2 Score 0    Health Maintenance Due  Topic Date Due   HIV Screening  Never done   Hepatitis C Screening  Never done   COLONOSCOPY (Pts 45-55yrs Insurance coverage will need to be confirmed)  Never done     ROS: Per HPI, otherwise a complete review of systems was negative.   PMH:  The following were reviewed and entered/updated in epic: Past Medical History:  Diagnosis Date   Fainting spell    Hyperlipidemia    Patient Active Problem List   Diagnosis Date Noted   Acne 06/29/2020   OSA (obstructive sleep apnea) 01/21/2020   Prediabetes 05/31/2019   Dyslipidemia 05/31/2019   Attention deficit hyperactivity disorder (ADHD) 05/31/2019   Erectile dysfunction 05/31/2019   Past Surgical History:  Procedure Laterality Date   ANKLE SURGERY Left    BACK SURGERY     KNEE ARTHROSCOPY WITH MENISCAL REPAIR     KNEE SURGERY Left    SHOULDER ARTHROSCOPY WITH LABRAL REPAIR      Family History  Problem Relation Age of Onset   Cancer Father    Liver cancer Father    Cancer Paternal Aunt    Prostate cancer Neg Hx    Colon cancer Neg Hx     Medications- reviewed and updated Current Outpatient Medications  Medication Sig Dispense Refill   atomoxetine (STRATTERA) 80 MG capsule 80 mg daily.     Multiple Vitamin (MULTI VITAMIN DAILY PO) Take 2 tablets  by mouth daily.     sildenafil (VIAGRA) 100 MG tablet TAKE ONE TABLET BY MOUTH DAILY AS NEEDED 30 tablet 2   No current facility-administered medications for this visit.    Allergies-reviewed and updated No Known Allergies  Social History   Socioeconomic History   Marital status: Married    Spouse name: Not on file   Number of children: Not on file   Years of  education: Not on file   Highest education level: Not on file  Occupational History   Not on file  Tobacco Use   Smoking status: Never   Smokeless tobacco: Never  Vaping Use   Vaping Use: Never used  Substance and Sexual Activity   Alcohol use: Yes    Comment: occasional   Drug use: No   Sexual activity: Not on file  Other Topics Concern   Not on file  Social History Narrative   Not on file   Social Determinants of Health   Financial Resource Strain: Not on file  Food Insecurity: Not on file  Transportation Needs: Not on file  Physical Activity: Not on file  Stress: Not on file  Social Connections: Not on file        Objective:  Physical Exam: BP 121/77   Pulse 97   Temp 98 F (36.7 C) (Temporal)   Ht 6\' 5"  (1.956 m)   Wt 218 lb (98.9 kg)   SpO2 98%   BMI 25.85 kg/m   Body mass index is 25.85 kg/m. Wt Readings from Last 3 Encounters:  07/20/21 218 lb (98.9 kg)  11/13/20 207 lb 12.8 oz (94.3 kg)  10/20/20 204 lb (92.5 kg)   Gen: NAD, resting comfortably HEENT: TMs normal bilaterally. OP clear. No thyromegaly noted.  CV: RRR with no murmurs appreciated Pulm: NWOB, CTAB with no crackles, wheezes, or rhonchi GI: Normal bowel sounds present. Soft, Nontender, Nondistended. MSK: no edema, cyanosis, or clubbing noted Skin: warm, dry Neuro: CN2-12 grossly intact. Strength 5/5 in upper and lower extremities. Reflexes symmetric and intact bilaterally.  Psych: Normal affect and thought content      I,Savera Zaman,acting as a scribe for 10/22/20, MD.,have documented all relevant documentation on the behalf of [name], MD,as directed by  [name], MD while in the presence of [name], MD.   I, [name], MD, have reviewed all documentation for this visit. The documentation on 07/20/21 for the exam, diagnosis, procedures, and orders are all accurate and complete.  07/22/21. Katina Degree, MD 07/20/2021 11:58 AM

## 2021-07-20 NOTE — Assessment & Plan Note (Signed)
Stable on viagra as needed.

## 2021-07-20 NOTE — Assessment & Plan Note (Signed)
Check A1c. 

## 2021-07-20 NOTE — Assessment & Plan Note (Signed)
Continue stattera per attention specialist

## 2021-07-21 NOTE — Progress Notes (Signed)
Please inform patient of the following:  His blood sugar is in the prediabetic range but everything else is NORMAL. Do not need to make any changes to his treatment plan at this time. He should keep working on diet and exercise and we can recheck in a year.

## 2021-08-13 ENCOUNTER — Other Ambulatory Visit: Payer: Self-pay | Admitting: Family Medicine

## 2021-08-13 ENCOUNTER — Encounter: Payer: Self-pay | Admitting: Family Medicine

## 2021-08-13 ENCOUNTER — Telehealth: Payer: Self-pay

## 2021-08-13 NOTE — Telephone Encounter (Signed)
LAST APPOINTMENT DATE:  07/20/21  NEXT APPOINTMENT DATE: none  MEDICATION:sildenafil (VIAGRA) 100 MG tablet  PHARMACY: [id number] [place], [place] - [address]

## 2021-08-13 NOTE — Telephone Encounter (Signed)
Rx send to pharmacy  

## 2021-12-21 ENCOUNTER — Other Ambulatory Visit: Payer: Self-pay | Admitting: Family Medicine

## 2022-03-22 ENCOUNTER — Other Ambulatory Visit: Payer: Self-pay | Admitting: Family Medicine

## 2022-05-30 ENCOUNTER — Encounter: Payer: Self-pay | Admitting: *Deleted

## 2022-08-01 ENCOUNTER — Other Ambulatory Visit: Payer: Self-pay | Admitting: Family Medicine

## 2022-08-02 ENCOUNTER — Encounter: Payer: Self-pay | Admitting: Family Medicine

## 2022-08-02 NOTE — Telephone Encounter (Signed)
Ok to refill.  David Huang. [name], MD 08/02/2022 12:50 PM

## 2022-08-03 ENCOUNTER — Other Ambulatory Visit: Payer: Self-pay | Admitting: *Deleted

## 2022-08-03 ENCOUNTER — Other Ambulatory Visit: Payer: Self-pay | Admitting: Family Medicine

## 2022-08-03 MED ORDER — SILDENAFIL CITRATE 100 MG PO TABS: 100.0000 mg | ORAL_TABLET | Freq: Every day | ORAL | 0 refills | Status: DC | PRN

## 2022-08-03 NOTE — Telephone Encounter (Signed)
Alternative Requested:NOT COVERED.  

## 2022-08-04 ENCOUNTER — Other Ambulatory Visit: Payer: Self-pay | Admitting: Family Medicine

## 2022-08-04 NOTE — Telephone Encounter (Signed)
  Pharmacy comment: Alternative Requested:NOT COVERED.  All Pharmacy Suggested Alternatives:  sildenafil (VIAGRA) 100 MG tablet  tadalafil (CIALIS) 20 MG tablet To prescribe one of the alternatives listed above, open the encounter and click Replace.  Open Encounter

## 2022-08-04 NOTE — Telephone Encounter (Signed)
Patient states: - He was calling to check for an update on sildenafil refill request - He wanted to know why medication was denied  I informed patient that a possibility of this denial was that he hadn't been seen in office in a year. I also informed pt that cialis 20 mg had been sent in for him instead of sildenafil. Pt verbalized understanding.   Patient stated he will pick up cialis.  Patient requests: -Clarification on why sildenafil was changed to cialis 20 mg. He would like a callback prior to leaving town on 12/01.

## 2022-08-05 NOTE — Telephone Encounter (Signed)
Patient wants medication to be sent to [facility] [id number] [place], [place] - [address]  Because [facility] denied it- saying its not covered- patient was advised to call [facility] and ask about the denial.-

## 2022-08-08 ENCOUNTER — Telehealth: Payer: Self-pay | Admitting: *Deleted

## 2022-08-08 NOTE — Telephone Encounter (Signed)
Patient states: -After [facility] informed him that cialis was not covered, he called insurance company to see why it wasn't  [employer] states it's not on list of covered meds but can be covered following completion of prior authorization.  [employer] faxed a PA following their phone call on 12/01.  Patient requests: -Confirmation that this PA has been received by [place]  - Updates on PA once it has been received   I informed patient that a PA can take 3-5 days until a decision in reached by insurance company. Pt verbalized understanding.

## 2022-08-08 NOTE — Telephone Encounter (Signed)
Key: [id number] - Rx #: [id number] Status Sent to Plan today Drug Tadalafil 20MG  tablets Waiting for determination

## 2022-08-10 NOTE — Telephone Encounter (Signed)
PA Denied  Drug not covered by plan  Patient notified

## 2022-08-11 ENCOUNTER — Other Ambulatory Visit: Payer: Self-pay | Admitting: *Deleted

## 2022-08-11 MED ORDER — TADALAFIL 20 MG PO TABS: 10.0000 mg | ORAL_TABLET | ORAL | 11 refills | Status: DC | PRN

## 2022-08-12 NOTE — Telephone Encounter (Signed)
Spoke with patient, patient aware Cialis was send to pharmacy

## 2022-08-18 ENCOUNTER — Encounter: Payer: Self-pay | Admitting: *Deleted

## 2022-08-25 ENCOUNTER — Encounter: Payer: Self-pay | Admitting: Family Medicine

## 2022-08-25 ENCOUNTER — Ambulatory Visit (INDEPENDENT_AMBULATORY_CARE_PROVIDER_SITE_OTHER): Payer: BC Managed Care – PPO | Admitting: Family Medicine

## 2022-08-25 VITALS — BP 122/78 | HR 95 | Temp 97.1°F | Ht 77.0 in | Wt 207.2 lb

## 2022-08-25 DIAGNOSIS — Z1211 Encounter for screening for malignant neoplasm of colon: Secondary | ICD-10-CM

## 2022-08-25 DIAGNOSIS — N529 Male erectile dysfunction, unspecified: Secondary | ICD-10-CM

## 2022-08-25 DIAGNOSIS — Z0001 Encounter for general adult medical examination with abnormal findings: Secondary | ICD-10-CM | POA: Diagnosis not present

## 2022-08-25 DIAGNOSIS — E785 Hyperlipidemia, unspecified: Secondary | ICD-10-CM

## 2022-08-25 DIAGNOSIS — R7303 Prediabetes: Secondary | ICD-10-CM

## 2022-08-25 DIAGNOSIS — F909 Attention-deficit hyperactivity disorder, unspecified type: Secondary | ICD-10-CM | POA: Diagnosis not present

## 2022-08-25 DIAGNOSIS — Z125 Encounter for screening for malignant neoplasm of prostate: Secondary | ICD-10-CM

## 2022-08-25 MED ORDER — TADALAFIL 20 MG PO TABS: 10.0000 mg | ORAL_TABLET | ORAL | 11 refills | Status: DC | PRN

## 2022-08-25 NOTE — Assessment & Plan Note (Signed)
Discussed lifestyle modifications.  Will check A1c with labs.

## 2022-08-25 NOTE — Assessment & Plan Note (Signed)
Check with his with labs.

## 2022-08-25 NOTE — Progress Notes (Signed)
Chief Complaint:  David Huang is a 51 y.o. male who presents today for his annual comprehensive physical exam.    Assessment/Plan:  Chronic Problems Addressed Today: Prediabetes Discussed lifestyle modifications.  Will check A1c with labs.  Dyslipidemia Check with his with labs.  Attention deficit hyperactivity disorder (ADHD) On Strattera for attention specialist.  Erectile dysfunction Stable on tadalafil.  Will refill today.  No significant side effects.  Preventative Healthcare: Will refer for colonoscopy.  Check labs.  Vaccines deferred.  Patient Counseling(The following topics were reviewed and/or handout was given):  -Nutrition: Stressed importance of moderation in sodium/caffeine intake, saturated fat and cholesterol, caloric balance, sufficient intake of fresh fruits, vegetables, and fiber.  -Stressed the importance of regular exercise.   -Substance Abuse: Discussed cessation/primary prevention of tobacco, alcohol, or other drug use; driving or other dangerous activities under the influence; availability of treatment for abuse.   -Injury prevention: Discussed safety belts, safety helmets, smoke detector, smoking near bedding or upholstery.   -Sexuality: Discussed sexually transmitted diseases, partner selection, use of condoms, avoidance of unintended pregnancy and contraceptive alternatives.   -Dental health: Discussed importance of regular tooth brushing, flossing, and dental visits.  -Health maintenance and immunizations reviewed. Please refer to Health maintenance section.  Return to care in 1 year for next preventative visit.     Subjective:  HPI:  He has no acute complaints today. See A/p for status of chronic conditions.   Lifestyle Diet: Balanced. Trying to get plenty of fruits and vegetables Exercise: Goes to gym and gets cardio routinely.      08/25/2022    8:00 AM  Depression screen PHQ 2/9  Decreased Interest 0  Down, Depressed, Hopeless 0  PHQ -  2 Score 0    Health Maintenance Due  Topic Date Due   COLONOSCOPY (Pts 45-41yrs Insurance coverage will need to be confirmed)  Never done     ROS: Per HPI, otherwise a complete review of systems was negative.   PMH:  The following were reviewed and entered/updated in epic: Past Medical History:  Diagnosis Date   Fainting spell    Hyperlipidemia    Patient Active Problem List   Diagnosis Date Noted   Acne 06/29/2020   OSA (obstructive sleep apnea) 01/21/2020   Prediabetes 05/31/2019   Dyslipidemia 05/31/2019   Attention deficit hyperactivity disorder (ADHD) 05/31/2019   Erectile dysfunction 05/31/2019   Past Surgical History:  Procedure Laterality Date   ANKLE SURGERY Left    BACK SURGERY     KNEE ARTHROSCOPY WITH MENISCAL REPAIR     KNEE SURGERY Left    SHOULDER ARTHROSCOPY WITH LABRAL REPAIR      Family History  Problem Relation Age of Onset   Cancer Father    Liver cancer Father    Cancer Paternal Aunt    Prostate cancer Neg Hx    Colon cancer Neg Hx     Medications- reviewed and updated Current Outpatient Medications  Medication Sig Dispense Refill   atomoxetine (STRATTERA) 80 MG capsule 80 mg daily.     Multiple Vitamin (MULTI VITAMIN DAILY PO) Take 2 tablets by mouth daily.     tadalafil (CIALIS) 20 MG tablet Take 0.5-1 tablets (10-20 mg total) by mouth every other day as needed for erectile dysfunction. 10 tablet 11   No current facility-administered medications for this visit.    Allergies-reviewed and updated No Known Allergies  Social History   Socioeconomic History   Marital status: Married    Spouse name:  Not on file   Number of children: Not on file   Years of education: Not on file   Highest education level: Not on file  Occupational History   Not on file  Tobacco Use   Smoking status: Never   Smokeless tobacco: Never  Vaping Use   Vaping Use: Never used  Substance and Sexual Activity   Alcohol use: Yes    Comment: occasional    Drug use: No   Sexual activity: Not on file  Other Topics Concern   Not on file  Social History Narrative   Not on file   Social Determinants of Health   Financial Resource Strain: Not on file  Food Insecurity: Not on file  Transportation Needs: Not on file  Physical Activity: Not on file  Stress: Not on file  Social Connections: Not on file        Objective:  Physical Exam: BP 122/78   Pulse 95   Temp (!) 97.1 F (36.2 C) (Temporal)   Ht 6\' 5"  (1.956 m)   Wt 207 lb 3.2 oz (94 kg)   SpO2 100%   BMI 24.57 kg/m   Body mass index is 24.57 kg/m. Wt Readings from Last 3 Encounters:  08/25/22 207 lb 3.2 oz (94 kg)  07/20/21 218 lb (98.9 kg)  11/13/20 207 lb 12.8 oz (94.3 kg)   Gen: NAD, resting comfortably HEENT: TMs normal bilaterally. OP clear. No thyromegaly noted.  CV: RRR with no murmurs appreciated Pulm: NWOB, CTAB with no crackles, wheezes, or rhonchi GI: Normal bowel sounds present. Soft, Nontender, Nondistended. MSK: no edema, cyanosis, or clubbing noted Skin: warm, dry Neuro: CN2-12 grossly intact. Strength 5/5 in upper and lower extremities. Reflexes symmetric and intact bilaterally.  Psych: Normal affect and thought content     Miryah Ralls M. 01/13/21, MD 08/25/2022 8:24 AM

## 2022-08-25 NOTE — Patient Instructions (Signed)
It was very nice to see you today!  I will refill your medications  We will refer you for your colonoscopy.  Please come back for your blood work.  Will see back in year for your next physical.  Come back sooner if needed.  Take care, Dr [name]  PLEASE NOTE:  If you had any lab tests, please let us know if you have not heard back within a few days. You may see your results on mychart before we have a chance to review them but we will give you a call once they are reviewed by [place].   If we ordered any referrals today, please let us know if you have not heard from their office within the next week.   If you had any urgent prescriptions sent in today, please check with the pharmacy within an hour of our visit to make sure the prescription was transmitted appropriately.   Please try these tips to maintain a healthy lifestyle:  Eat at least 3 REAL meals and 1-2 snacks per day.  Aim for no more than 5 hours between eating.  If you eat breakfast, please do so within one hour of getting up.   Each meal should contain half fruits/vegetables, one quarter protein, and one quarter carbs (no bigger than a computer mouse)  Cut down on sweet beverages. This includes juice, soda, and sweet tea.   Drink at least 1 glass of water with each meal and aim for at least 8 glasses per day  Exercise at least 150 minutes every week.    Preventive Care 49-26 Years Old, Male Preventive care refers to lifestyle choices and visits with your health care provider that can promote health and wellness. Preventive care visits are also called wellness exams. What can I expect for my preventive care visit? Counseling During your preventive care visit, your health care provider may ask about your: Medical history, including: Past medical problems. Family medical history. Current health, including: Emotional well-being. Home life and relationship well-being. Sexual activity. Lifestyle, including: Alcohol,  nicotine or tobacco, and drug use. Access to firearms. Diet, exercise, and sleep habits. Safety issues such as seatbelt and bike helmet use. Sunscreen use. Work and work Astronomer. Physical exam Your health care provider will check your: Height and weight. These may be used to calculate your BMI (body mass index). BMI is a measurement that tells if you are at a healthy weight. Waist circumference. This measures the distance around your waistline. This measurement also tells if you are at a healthy weight and may help predict your risk of certain diseases, such as type 2 diabetes and high blood pressure. Heart rate and blood pressure. Body temperature. Skin for abnormal spots. What immunizations do I need?  Vaccines are usually given at various ages, according to a schedule. Your health care provider will recommend vaccines for you based on your age, medical history, and lifestyle or other factors, such as travel or where you work. What tests do I need? Screening Your health care provider may recommend screening tests for certain conditions. This may include: Lipid and cholesterol levels. Diabetes screening. This is done by checking your blood sugar (glucose) after you have not eaten for a while (fasting). Hepatitis B test. Hepatitis C test. HIV (human immunodeficiency virus) test. STI (sexually transmitted infection) testing, if you are at risk. Lung cancer screening. Prostate cancer screening. Colorectal cancer screening. Talk with your health care provider about your test results, treatment options, and if necessary, the need  for more tests. Follow these instructions at home: Eating and drinking  Eat a diet that includes fresh fruits and vegetables, whole grains, lean protein, and low-fat dairy products. Take vitamin and mineral supplements as recommended by your health care provider. Do not drink alcohol if your health care provider tells you not to drink. If you drink  alcohol: Limit how much you have to 0-2 drinks a day. Know how much alcohol is in your drink. In the [place] one drink equals one 12 oz bottle of beer (355 mL), one 5 oz glass of wine (148 mL), or one 1 oz glass of hard liquor (44 mL). Lifestyle Brush your teeth every morning and night with fluoride toothpaste. Floss one time each day. Exercise for at least 30 minutes 5 or more days each week. Do not use any products that contain nicotine or tobacco. These products include cigarettes, chewing tobacco, and vaping devices, such as e-cigarettes. If you need help quitting, ask your health care provider. Do not use drugs. If you are sexually active, practice safe sex. Use a condom or other form of protection to prevent STIs. Take aspirin only as told by your health care provider. Make sure that you understand how much to take and what form to take. Work with your health care provider to find out whether it is safe and beneficial for you to take aspirin daily. Find healthy ways to manage stress, such as: Meditation, yoga, or listening to music. Journaling. Talking to a trusted person. Spending time with friends and family. Minimize exposure to UV radiation to reduce your risk of skin cancer. Safety Always wear your seat belt while driving or riding in a vehicle. Do not drive: If you have been drinking alcohol. Do not ride with someone who has been drinking. When you are tired or distracted. While texting. If you have been using any mind-altering substances or drugs. Wear a helmet and other protective equipment during sports activities. If you have firearms in your house, make sure you follow all gun safety procedures. What's next? Go to your health care provider once a year for an annual wellness visit. Ask your health care provider how often you should have your eyes and teeth checked. Stay up to date on all vaccines. This information is not intended to replace advice given to you by your  health care provider. Make sure you discuss any questions you have with your health care provider. Document Revised: 02/17/2021 Document Reviewed: 02/17/2021 Elsevier Patient Education  2023 [employer].

## 2022-08-25 NOTE — Assessment & Plan Note (Signed)
On Strattera for attention specialist.

## 2022-08-25 NOTE — Assessment & Plan Note (Signed)
Stable on tadalafil.  Will refill today.  No significant side effects.

## 2022-08-26 ENCOUNTER — Other Ambulatory Visit: Payer: BC Managed Care – PPO

## 2022-08-26 ENCOUNTER — Other Ambulatory Visit (HOSPITAL_COMMUNITY): Payer: Self-pay

## 2022-08-26 ENCOUNTER — Other Ambulatory Visit (INDEPENDENT_AMBULATORY_CARE_PROVIDER_SITE_OTHER): Payer: BC Managed Care – PPO

## 2022-08-26 DIAGNOSIS — R7303 Prediabetes: Secondary | ICD-10-CM | POA: Diagnosis not present

## 2022-08-26 DIAGNOSIS — Z125 Encounter for screening for malignant neoplasm of prostate: Secondary | ICD-10-CM | POA: Diagnosis not present

## 2022-08-26 DIAGNOSIS — Z0001 Encounter for general adult medical examination with abnormal findings: Secondary | ICD-10-CM

## 2022-08-26 DIAGNOSIS — E785 Hyperlipidemia, unspecified: Secondary | ICD-10-CM

## 2022-08-26 LAB — LIPID PANEL
Cholesterol: 206 mg/dL — ABNORMAL HIGH (ref 0–200)
HDL: 62.2 mg/dL (ref >39.00)
LDL Cholesterol: 137 mg/dL — ABNORMAL HIGH (ref 0–99)
NonHDL: 143.42
Total CHOL/HDL Ratio: 3
Triglycerides: 31 mg/dL (ref 0.0–149.0)
VLDL: 6.2 mg/dL (ref 0.0–40.0)

## 2022-08-26 LAB — COMPREHENSIVE METABOLIC PANEL
ALT: 17 U/L (ref 0–53)
AST: 25 U/L (ref 0–37)
Albumin: 4.5 g/dL (ref 3.5–5.2)
Alkaline Phosphatase: 51 U/L (ref 39–117)
BUN: 16 mg/dL (ref 6–23)
CO2: 30 mEq/L (ref 19–32)
Calcium: 9.7 mg/dL (ref 8.4–10.5)
Chloride: 103 mEq/L (ref 96–112)
Creatinine, Ser: 1.47 mg/dL (ref 0.40–1.50)
GFR: 55.81 mL/min — ABNORMAL LOW (ref >60.00)
Glucose, Bld: 91 mg/dL (ref 70–99)
Potassium: 4.9 mEq/L (ref 3.5–5.1)
Sodium: 139 mEq/L (ref 135–145)
Total Bilirubin: 0.6 mg/dL (ref 0.2–1.2)
Total Protein: 7.1 g/dL (ref 6.0–8.3)

## 2022-08-26 LAB — CBC
HCT: 41.8 % (ref 39.0–52.0)
Hemoglobin: 14.2 g/dL (ref 13.0–17.0)
MCHC: 34.1 g/dL (ref 30.0–36.0)
MCV: 99.2 fl (ref 78.0–100.0)
Platelets: 232 10*3/uL (ref 150.0–400.0)
RBC: 4.22 Mil/uL (ref 4.22–5.81)
RDW: 14.2 % (ref 11.5–15.5)
WBC: 4.4 10*3/uL (ref 4.0–10.5)

## 2022-08-26 LAB — HEMOGLOBIN A1C: Hgb A1c MFr Bld: 5.8 % (ref 4.6–6.5)

## 2022-08-27 LAB — TSH: TSH: 0.5 u[IU]/mL (ref 0.35–5.50)

## 2022-08-27 LAB — PSA: PSA: 1.01 ng/mL (ref 0.10–4.00)

## 2022-08-30 NOTE — Progress Notes (Signed)
Please inform patient of the following:  Labs are all stable compared to previous. His cholesterol is borderline elevated and his blood sugar is borderline as well. Do not need to start medications but he should continue to work on diet and exercise and we can recheck in a year.

## 2022-09-01 ENCOUNTER — Encounter: Payer: BC Managed Care – PPO | Admitting: Family Medicine

## 2022-10-18 ENCOUNTER — Other Ambulatory Visit: Payer: Self-pay | Admitting: Family Medicine

## 2022-10-20 ENCOUNTER — Other Ambulatory Visit: Payer: Self-pay | Admitting: Family Medicine

## 2022-10-20 NOTE — Telephone Encounter (Signed)
Last refill: never filled by PCP Last OV: 08/25/22 dx. CPE

## 2022-10-20 NOTE — Telephone Encounter (Signed)
This has previously been prescribed by his attention specialist - recommend he check with them.

## 2022-11-18 ENCOUNTER — Encounter: Payer: Self-pay | Admitting: Gastroenterology

## 2022-12-09 ENCOUNTER — Ambulatory Visit (AMBULATORY_SURGERY_CENTER): Payer: BC Managed Care – PPO

## 2022-12-09 ENCOUNTER — Encounter: Payer: Self-pay | Admitting: Gastroenterology

## 2022-12-09 VITALS — Ht 77.0 in | Wt 215.0 lb

## 2022-12-09 DIAGNOSIS — Z1211 Encounter for screening for malignant neoplasm of colon: Secondary | ICD-10-CM

## 2022-12-09 MED ORDER — NA SULFATE-K SULFATE-MG SULF 17.5-3.13-1.6 GM/177ML PO SOLN: 1.0000 | Freq: Once | ORAL | 0 refills | Status: AC

## 2022-12-09 NOTE — Progress Notes (Signed)
No egg or soy allergy known to patient  No issues known to pt with past sedation with any surgeries or procedures Patient denies ever being told they had issues or difficulty with intubation  No FH of Malignant Hyperthermia Pt is not on diet pills Pt is not on  home 02  Pt is not on blood thinners  Pt denies issues with constipation  No A fib or A flutter Have any cardiac testing pending--no Pt instructed to use [email or web address] or GoodRx for a price reduction on prep   

## 2022-12-29 ENCOUNTER — Ambulatory Visit (AMBULATORY_SURGERY_CENTER): Payer: BC Managed Care – PPO | Admitting: Gastroenterology

## 2022-12-29 ENCOUNTER — Encounter: Payer: Self-pay | Admitting: Gastroenterology

## 2022-12-29 VITALS — BP 115/79 | HR 61 | Temp 97.7°F | Resp 18 | Ht 77.0 in | Wt 215.0 lb

## 2022-12-29 DIAGNOSIS — Z1211 Encounter for screening for malignant neoplasm of colon: Secondary | ICD-10-CM | POA: Diagnosis present

## 2022-12-29 MED ORDER — SODIUM CHLORIDE 0.9 % IV SOLN: 500.0000 mL | Freq: Once | INTRAVENOUS | Status: DC

## 2022-12-29 NOTE — Progress Notes (Signed)
History and Physical:  This patient presents for endoscopic testing for: Encounter Diagnosis  Name Primary?   Special screening for malignant neoplasms, colon Yes    Average risk for colorectal cancer.  First screening exam. Patient denies chronic abdominal pain, rectal bleeding, constipation or diarrhea.   Patient is otherwise without complaints or active issues today.   Past Medical History: Past Medical History:  Diagnosis Date   Allergy    Arthritis    Fainting spell    Hyperlipidemia    Sleep apnea      Past Surgical History: Past Surgical History:  Procedure Laterality Date   ANKLE SURGERY Left    BACK SURGERY     KNEE ARTHROSCOPY WITH MENISCAL REPAIR     KNEE SURGERY Left    SHOULDER ARTHROSCOPY WITH LABRAL REPAIR      Allergies: No Known Allergies  Outpatient Meds: Current Outpatient Medications  Medication Sig Dispense Refill   atomoxetine (STRATTERA) 80 MG capsule 80 mg daily.     Multiple Vitamin (MULTI VITAMIN DAILY PO) Take 2 tablets by mouth daily.     traZODone (DESYREL) 50 MG tablet Take 50-100 mg by mouth at bedtime as needed.     sildenafil (REVATIO) 20 MG tablet Take 20 mg by mouth 3 (three) times daily.     tadalafil (CIALIS) 20 MG tablet Take 0.5-1 tablets (10-20 mg total) by mouth every other day as needed for erectile dysfunction. (Patient not taking: Reported on 12/09/2022) 10 tablet 11   Current Facility-Administered Medications  Medication Dose Route Frequency Provider Last Rate Last Admin   0.9 %  sodium chloride infusion  500 mL Intravenous Once Sherrilyn Rist, MD          ___________________________________________________________________ Objective   Exam:  BP 108/79   Pulse 73   Temp 97.7 F (36.5 C)   Ht  (1.956 m)   Wt 215 lb (97.5 kg)   SpO2 99%   BMI 25.50 kg/m   CV: regular , S1/S2 Resp: clear to auscultation bilaterally, normal RR and effort noted GI: soft, no tenderness, with active bowel  sounds.   Assessment: Encounter Diagnosis  Name Primary?   Special screening for malignant neoplasms, colon Yes     Plan: Colonoscopy  The benefits and risks of the planned procedure were described in detail with the patient or (when appropriate) their health care proxy.  Risks were outlined as including, but not limited to, bleeding, infection, perforation, adverse medication reaction leading to cardiac or pulmonary decompensation, pancreatitis (if ERCP).  The limitation of incomplete mucosal visualization was also discussed.  No guarantees or warranties were given.    The patient is appropriate for an endoscopic procedure in the ambulatory setting.   - Amada Jupiter, MD

## 2022-12-29 NOTE — Progress Notes (Signed)
To pacu, VSS. Report to Rn.tb 

## 2022-12-29 NOTE — Patient Instructions (Signed)
Resume all previous medicines today as ordered.  YOU HAD AN ENDOSCOPIC PROCEDURE TODAY AT THE [facility]:   Refer to the procedure report that was given to you for any specific questions about what was found during the examination.  If the procedure report does not answer your questions, please call your gastroenterologist to clarify.  If you requested that your care partner not be given the details of your procedure findings, then the procedure report has been included in a sealed envelope for you to review at your convenience later.  YOU SHOULD EXPECT: Some feelings of bloating in the abdomen. Passage of more gas than usual.  Walking can help get rid of the air that was put into your GI tract during the procedure and reduce the bloating. If you had a lower endoscopy (such as a colonoscopy or flexible sigmoidoscopy) you may notice spotting of blood in your stool or on the toilet paper. If you underwent a bowel prep for your procedure, you may not have a normal bowel movement for a few days.  Please Note:  You might notice some irritation and congestion in your nose or some drainage.  This is from the oxygen used during your procedure.  There is no need for concern and it should clear up in a day or so.  SYMPTOMS TO REPORT IMMEDIATELY:  Following lower endoscopy (colonoscopy or flexible sigmoidoscopy):  Excessive amounts of blood in the stool  Significant tenderness or worsening of abdominal pains  Swelling of the abdomen that is new, acute  Fever of 100F or higher   For urgent or emergent issues, a gastroenterologist can be reached at any hour by calling [phone]. Do not use MyChart messaging for urgent concerns.    DIET:  We do recommend a small meal at first, but then you may proceed to your regular diet.  Drink plenty of fluids but you should avoid alcoholic beverages for 24 hours. Try to eat a high fiber diet, and drink plenty of water.  ACTIVITY:  You should plan to  take it easy for the rest of today and you should NOT DRIVE or use heavy machinery until tomorrow (because of the sedation medicines used during the test).    FOLLOW UP: Our staff will call the number listed on your records the next business day following your procedure.  We will call around 7:15- 8:00 am to check on you and address any questions or concerns that you may have regarding the information given to you following your procedure. If we do not reach you, we will leave a message.      SIGNATURES/CONFIDENTIALITY: You and/or your care partner have signed paperwork which will be entered into your electronic medical record.  These signatures attest to the fact that that the information above on your After Visit Summary has been reviewed and is understood.  Full responsibility of the confidentiality of this discharge information lies with you and/or your care-partner.

## 2022-12-29 NOTE — Progress Notes (Signed)
Pt's states no medical or surgical changes since previsit or office visit. 

## 2022-12-29 NOTE — Op Note (Signed)
[facility] Patient Name: David Huang Procedure Date: 12/29/2022 8:18 AM MRN: [id number] Endoscopist: Sherilyn Cooter L. Myrtie Neither , MD, [id number] Age: 52 Referring MD:  Date of Birth: May 24, 1971 Gender: Male Account #: [id number] Procedure:                Colonoscopy Indications:              Screening for colorectal malignant neoplasm, This                            is the patient's first colonoscopy Medicines:                Monitored Anesthesia Care Procedure:                Pre-Anesthesia Assessment:                           - Prior to the procedure, a History and Physical                            was performed, and patient medications and                            allergies were reviewed. The patient's tolerance of                            previous anesthesia was also reviewed. The risks                            and benefits of the procedure and the sedation                            options and risks were discussed with the patient.                            All questions were answered, and informed consent                            was obtained. Prior Anticoagulants: The patient has                            taken no anticoagulant or antiplatelet agents. ASA                            Grade Assessment: II - A patient with mild systemic                            disease. After reviewing the risks and benefits,                            the patient was deemed in satisfactory condition to                            undergo the procedure.  After obtaining informed consent, the colonoscope                            was passed under direct vision. Throughout the                            procedure, the patient's blood pressure, pulse, and                            oxygen saturations were monitored continuously. The                            CF [id number] #[id number] was introduced through the anus                            and advanced to the the  cecum, identified by                            appendiceal orifice and ileocecal valve. The                            colonoscopy was performed without difficulty. The                            patient tolerated the procedure well. The quality                            of the bowel preparation was good. The ileocecal                            valve, appendiceal orifice, and rectum were                            photographed. Scope In: 8:32:24 AM Scope Out: 8:45:39 AM Scope Withdrawal Time: 0 hours 10 minutes 55 seconds  Total Procedure Duration: 0 hours 13 minutes 15 seconds  Findings:                 The perianal and digital rectal examinations were                            normal.                           Repeat examination of right colon under NBI                            performed.                           A few diverticula were found in the right colon.                           The exam was otherwise without abnormality on  direct and retroflexion views. Complications:            No immediate complications. Impression:               - Diverticulosis in the right colon.                           - The examination was otherwise normal on direct                            and retroflexion views.                           - No specimens collected. Recommendation:           - Patient has a contact number available for                            emergencies. The signs and symptoms of potential                            delayed complications were discussed with the                            patient. Return to normal activities tomorrow.                            Written discharge instructions were provided to the                            patient.                           - Resume previous diet.                           - Continue present medications.                           - Repeat colonoscopy in 10 years for screening                             purposes. Kayshawn Ozburn L. Myrtie Neither, MD 12/29/2022 8:49:57 AM This report has been signed electronically.

## 2022-12-30 ENCOUNTER — Telehealth: Payer: Self-pay

## 2022-12-30 NOTE — Telephone Encounter (Signed)
  Follow up Call-     12/29/2022    8:10 AM  Call back number  Post procedure Call Back phone  # [phone]  Permission to leave phone message Yes     Patient questions:  Do you have a fever, pain , or abdominal swelling? No. Pain Score  0 *  Have you tolerated food without any problems? Yes.    Have you been able to return to your normal activities? Yes.    Do you have any questions about your discharge instructions: Diet   No. Medications  No. Follow up visit  No.  Do you have questions or concerns about your Care? No.  Actions: * If pain score is 4 or above: No action needed, pain <4.

## 2023-03-23 ENCOUNTER — Ambulatory Visit (INDEPENDENT_AMBULATORY_CARE_PROVIDER_SITE_OTHER): Payer: Self-pay | Admitting: Podiatry

## 2023-03-23 DIAGNOSIS — Z91199 Patient's noncompliance with other medical treatment and regimen due to unspecified reason: Secondary | ICD-10-CM

## 2023-03-23 NOTE — Progress Notes (Signed)
No show

## 2023-04-22 ENCOUNTER — Encounter: Payer: Self-pay | Admitting: Family Medicine

## 2023-04-24 NOTE — Telephone Encounter (Signed)
Please advise 

## 2023-04-24 NOTE — Telephone Encounter (Signed)
20 mg is the highest dose I can prescribe. We can try switching to Viagra or we can refer him to urology.  Katina Degree. [name], MD 04/24/2023 10:23 AM

## 2023-04-26 NOTE — Telephone Encounter (Signed)
See note

## 2023-04-27 NOTE — Telephone Encounter (Signed)
Ok to send in Viagra 100 mg daily as needed for erectile dysfunction. Dispense #30 with 3 refills.  Katina Degree. [name], MD 04/27/2023 7:52 AM

## 2023-04-28 ENCOUNTER — Other Ambulatory Visit: Payer: Self-pay

## 2023-04-28 DIAGNOSIS — N529 Male erectile dysfunction, unspecified: Secondary | ICD-10-CM

## 2023-04-28 MED ORDER — SILDENAFIL CITRATE 100 MG PO TABS
50.0000 mg | ORAL_TABLET | Freq: Every day | ORAL | 3 refills | Status: DC | PRN
Start: 2023-04-28 — End: 2023-09-27

## 2023-04-28 MED ORDER — SILDENAFIL CITRATE 100 MG PO TABS
50.0000 mg | ORAL_TABLET | Freq: Every day | ORAL | 3 refills | Status: DC | PRN
Start: 2023-04-28 — End: 2023-04-28

## 2023-07-10 ENCOUNTER — Encounter: Payer: BC Managed Care – PPO | Admitting: Family Medicine

## 2023-08-28 ENCOUNTER — Encounter: Payer: BC Managed Care – PPO | Admitting: Family Medicine

## 2023-09-15 ENCOUNTER — Other Ambulatory Visit: Payer: Self-pay | Admitting: Family Medicine

## 2023-09-15 DIAGNOSIS — N529 Male erectile dysfunction, unspecified: Secondary | ICD-10-CM

## 2023-09-15 NOTE — Telephone Encounter (Signed)
 Copied from CRM [id number]. Topic: Clinical - Medication Refill >> Sep 15, 2023 11:54 AM Mercedes MATSU wrote: Most Recent Primary Care Visit:  Provider: KENNYTH WORTH HERO  Department: [facility]  Visit Type: PHYSICAL  Date: 08/25/2022  Medication: ***  Has the patient contacted their pharmacy?  (Agent: If no, request that the patient contact the pharmacy for the refill. If patient does not wish to contact the pharmacy document the reason why and proceed with request.) (Agent: If yes, when and what did the pharmacy advise?)  Is this the correct pharmacy for this prescription?  If no, delete pharmacy and type the correct one.  This is the patient's preferred pharmacy:  [id number] [place], [place] - [address] [place] [address] Phone: [phone] Fax: [phone]  [id number] - [place], [place] - [address] [place] [address] Phone: [phone] Fax: [phone]  [id number] - [place], [place] - [address] [place] [address] Phone: [phone] Fax: [phone]   Has the prescription been filled recently?   Is the patient out of the medication?   Has the patient been seen for an appointment in the last year OR does the patient have an upcoming appointment?   Can we respond through MyChart?   Agent: Please be advised that Rx refills may take up to 3 business days. We ask that you follow-up with your pharmacy.

## 2023-09-27 ENCOUNTER — Other Ambulatory Visit: Payer: Self-pay | Admitting: Family Medicine

## 2023-09-27 DIAGNOSIS — N529 Male erectile dysfunction, unspecified: Secondary | ICD-10-CM

## 2023-09-27 NOTE — Telephone Encounter (Signed)
LAST APPOINTMENT DATE: 08/2022  NEXT APPOINTMENT DATE: 10/10/2023   Last Refill: 04/28/2023 #30 with 3 Ref :

## 2023-10-04 ENCOUNTER — Other Ambulatory Visit: Payer: Self-pay | Admitting: Family Medicine

## 2023-10-05 NOTE — Telephone Encounter (Signed)
This needs to go to his attention specialist.

## 2023-10-06 ENCOUNTER — Other Ambulatory Visit: Payer: Self-pay | Admitting: Family Medicine

## 2023-10-06 NOTE — Telephone Encounter (Signed)
Copied from CRM [id number]. Topic: Clinical - Medication Refill >> Oct 06, 2023  2:25 PM Almira Coaster wrote: Most Recent Primary Care Visit:  Provider: Ardith Dark  Department: [facility]  Visit Type: PHYSICAL  Date: 08/25/2022  Medication: atomoxetine (STRATTERA) 80 MG capsule  Has the patient contacted their pharmacy? Yes, informed patient to contact PCP (Agent: If no, request that the patient contact the pharmacy for the refill. If patient does not wish to contact the pharmacy document the reason why and proceed with request.) (Agent: If yes, when and what did the pharmacy advise?)  Is this the correct pharmacy for this prescription? Yes If no, delete pharmacy and type the correct one.  This is the patient's preferred pharmacy:   [id number] [place], [place] - [address] [place] [address] Phone: [phone] Fax: [phone]   Has the prescription been filled recently? No  Is the patient out of the medication? Yes  Has the patient been seen for an appointment in the last year OR does the patient have an upcoming appointment? Yes  Can we respond through MyChart? Yes  Agent: Please be advised that Rx refills may take up to 3 business days. We ask that you follow-up with your pharmacy. >> Oct 06, 2023  2:27 PM Almira Coaster wrote: Patient is going out of town and would like this medication today if possible.

## 2023-10-06 NOTE — Telephone Encounter (Signed)
Last Fill: 10/09/2018  Last OV: 08/25/22 Next OV: 10/10/23  Routing to provider for review/authorization.

## 2023-10-06 NOTE — Telephone Encounter (Signed)
Copied from CRM [id number]. Topic: Clinical - Medication Refill >> Oct 06, 2023  2:25 PM Almira Coaster wrote: Most Recent Primary Care Visit:  Provider: Ardith Dark  Department: [facility]  Visit Type: PHYSICAL  Date: 08/25/2022  Medication: atomoxetine (STRATTERA) 80 MG capsule  Has the patient contacted their pharmacy? Yes, informed patient to contact PCP (Agent: If no, request that the patient contact the pharmacy for the refill. If patient does not wish to contact the pharmacy document the reason why and proceed with request.) (Agent: If yes, when and what did the pharmacy advise?)  Is this the correct pharmacy for this prescription? Yes If no, delete pharmacy and type the correct one.  This is the patient's preferred pharmacy:   [id number] [place], [place] - [address] [place] [address] Phone: [phone] Fax: [phone]   Has the prescription been filled recently? No  Is the patient out of the medication? Yes  Has the patient been seen for an appointment in the last year OR does the patient have an upcoming appointment? Yes  Can we respond through MyChart? Yes  Agent: Please be advised that Rx refills may take up to 3 business days. We ask that you follow-up with your pharmacy.

## 2023-10-10 ENCOUNTER — Encounter: Payer: Self-pay | Admitting: Family Medicine

## 2023-10-10 ENCOUNTER — Ambulatory Visit (INDEPENDENT_AMBULATORY_CARE_PROVIDER_SITE_OTHER): Payer: 59 | Admitting: Family Medicine

## 2023-10-10 VITALS — BP 122/82 | HR 69 | Temp 97.3°F | Ht 77.0 in | Wt 206.2 lb

## 2023-10-10 DIAGNOSIS — F909 Attention-deficit hyperactivity disorder, unspecified type: Secondary | ICD-10-CM | POA: Diagnosis not present

## 2023-10-10 DIAGNOSIS — R7303 Prediabetes: Secondary | ICD-10-CM | POA: Diagnosis not present

## 2023-10-10 DIAGNOSIS — N529 Male erectile dysfunction, unspecified: Secondary | ICD-10-CM | POA: Diagnosis not present

## 2023-10-10 DIAGNOSIS — Z125 Encounter for screening for malignant neoplasm of prostate: Secondary | ICD-10-CM | POA: Diagnosis not present

## 2023-10-10 DIAGNOSIS — Z0001 Encounter for general adult medical examination with abnormal findings: Secondary | ICD-10-CM

## 2023-10-10 DIAGNOSIS — E785 Hyperlipidemia, unspecified: Secondary | ICD-10-CM | POA: Diagnosis not present

## 2023-10-10 LAB — CBC
HCT: 44.5 % (ref 39.0–52.0)
Hemoglobin: 14.7 g/dL (ref 13.0–17.0)
MCHC: 33.1 g/dL (ref 30.0–36.0)
MCV: 99.9 fL (ref 78.0–100.0)
Platelets: 222 10*3/uL (ref 150.0–400.0)
RBC: 4.45 Mil/uL (ref 4.22–5.81)
RDW: 14.6 % (ref 11.5–15.5)
WBC: 4.3 10*3/uL (ref 4.0–10.5)

## 2023-10-10 LAB — COMPREHENSIVE METABOLIC PANEL
ALT: 14 U/L (ref 0–53)
AST: 26 U/L (ref 0–37)
Albumin: 4.7 g/dL (ref 3.5–5.2)
Alkaline Phosphatase: 52 U/L (ref 39–117)
BUN: 15 mg/dL (ref 6–23)
CO2: 29 meq/L (ref 19–32)
Calcium: 10 mg/dL (ref 8.4–10.5)
Chloride: 103 meq/L (ref 96–112)
Creatinine, Ser: 1.57 mg/dL — ABNORMAL HIGH (ref 0.40–1.50)
GFR: 51.17 mL/min — ABNORMAL LOW (ref >60.00)
Glucose, Bld: 104 mg/dL — ABNORMAL HIGH (ref 70–99)
Potassium: 4.7 meq/L (ref 3.5–5.1)
Sodium: 140 meq/L (ref 135–145)
Total Bilirubin: 0.6 mg/dL (ref 0.2–1.2)
Total Protein: 7.4 g/dL (ref 6.0–8.3)

## 2023-10-10 LAB — LIPID PANEL
Cholesterol: 206 mg/dL — ABNORMAL HIGH (ref 0–200)
HDL: 66.8 mg/dL (ref >39.00)
LDL Cholesterol: 129 mg/dL — ABNORMAL HIGH (ref 0–99)
NonHDL: 139.62
Total CHOL/HDL Ratio: 3
Triglycerides: 54 mg/dL (ref 0.0–149.0)
VLDL: 10.8 mg/dL (ref 0.0–40.0)

## 2023-10-10 LAB — HEMOGLOBIN A1C: Hgb A1c MFr Bld: 6.1 % (ref 4.6–6.5)

## 2023-10-10 LAB — PSA: PSA: 1.16 ng/mL (ref 0.10–4.00)

## 2023-10-10 LAB — TSH: TSH: 0.67 u[IU]/mL (ref 0.35–5.50)

## 2023-10-10 MED ORDER — ATOMOXETINE HCL 80 MG PO CAPS: 80.0000 mg | ORAL_CAPSULE | Freq: Every day | ORAL | 3 refills | Status: AC

## 2023-10-10 NOTE — Assessment & Plan Note (Signed)
Discussed lifestyle modifications.  He is doing a great job with diet and exercise.  Check A1c.

## 2023-10-10 NOTE — Assessment & Plan Note (Signed)
 Check lipids. Discussed lifestyle modifications.

## 2023-10-10 NOTE — Assessment & Plan Note (Signed)
 Had lengthy discussion with patient today regarding his ADHD management.  He has previously been seeing psychiatry for this and has been on Strattera  80 mg daily for many years.  He would like for us  to take over this prescription.He has been stable on this dose and has done well with this without any significant side effects.  Medication does help with his ability to stay focused and on task.  We will refill today. Follow up in 6 months.

## 2023-10-10 NOTE — Progress Notes (Signed)
 Chief Complaint:  David Huang is a 53 y.o. male who presents today for his annual comprehensive physical exam.    Assessment/Plan:  Chronic Problems Addressed Today: Prediabetes Discussed lifestyle modifications.  He is doing a great job with diet and exercise.  Check A1c.  Dyslipidemia Check lipids.  Discussed lifestyle modifications.  Attention deficit hyperactivity disorder (ADHD) Had lengthy discussion with patient today regarding his ADHD management.  He has previously been seeing psychiatry for this and has been on Strattera  80 mg daily for many years.  He would like for us  to take over this prescription.He has been stable on this dose and has done well with this without any significant side effects.  Medication does help with his ability to stay focused and on task.  We will refill today. Follow up in 6 months.   Erectile dysfunction Stable on Viagra  50 to 100 mg daily as needed.  Tolerates well.  No side effects.  Preventative Healthcare: Due for colonoscopy in 10 years.  Due for shingles vaccine though he deferred for today.  He can get this at the pharmacy or we can discuss again next year.  Will check labs today.  Patient Counseling(The following topics were reviewed and/or handout was given):  -Nutrition: Stressed importance of moderation in sodium/caffeine intake, saturated fat and cholesterol, caloric balance, sufficient intake of fresh fruits, vegetables, and fiber.  -Stressed the importance of regular exercise.   -Substance Abuse: Discussed cessation/primary prevention of tobacco, alcohol, or other drug use; driving or other dangerous activities under the influence; availability of treatment for abuse.   -Injury prevention: Discussed safety belts, safety helmets, smoke detector, smoking near bedding or upholstery.   -Sexuality: Discussed sexually transmitted diseases, partner selection, use of condoms, avoidance of unintended pregnancy and contraceptive alternatives.    -Dental health: Discussed importance of regular tooth brushing, flossing, and dental visits.  -Health maintenance and immunizations reviewed. Please refer to Health maintenance section.  Return to care in 1 year for next preventative visit.     Subjective:  HPI:  He has no acute complaints today. See Assessment / plan for status of chronic conditions.  Patient last seen here about 51 years ago.  Overall is doing well today.  He would like for us  to take over management for his Strattera .  He has been diagnosed with ADHD and has been following with psychiatry for many years for this.  He has been on his current dose Strattera  80 mg daily for many years.  Tolerates well.  No significant side effects.  Medication does help with his ability stay focused and on task at work.  Lifestyle Diet: Balanced. Plenty of fruits and vegetables.  Exercise: Cardio and weight training 3-4 times per week.      10/10/2023    8:26 AM  Depression screen PHQ 2/9  Decreased Interest 0  Down, Depressed, Hopeless 0  PHQ - 2 Score 0    There are no preventive care reminders to display for this patient.   ROS: Per HPI, otherwise a complete review of systems was negative.   PMH:  The following were reviewed and entered/updated in epic: Past Medical History:  Diagnosis Date   Allergy    Arthritis    Fainting spell    Hyperlipidemia    Sleep apnea    Patient Active Problem List   Diagnosis Date Noted   Acne 06/29/2020   OSA (obstructive sleep apnea) 01/21/2020   Prediabetes 05/31/2019   Dyslipidemia 05/31/2019   Attention deficit  hyperactivity disorder (ADHD) 05/31/2019   Erectile dysfunction 05/31/2019   Past Surgical History:  Procedure Laterality Date   ANKLE SURGERY Left    BACK SURGERY     KNEE ARTHROSCOPY WITH MENISCAL REPAIR     KNEE SURGERY Left    SHOULDER ARTHROSCOPY WITH LABRAL REPAIR      Family History  Problem Relation Age of Onset   Colon polyps Mother    Cancer Father     Liver cancer Father    Cancer Paternal Aunt    Prostate cancer Neg Hx    Colon cancer Neg Hx    Esophageal cancer Neg Hx    Rectal cancer Neg Hx    Stomach cancer Neg Hx     Medications- reviewed and updated Current Outpatient Medications  Medication Sig Dispense Refill   Multiple Vitamin (MULTI VITAMIN DAILY PO) Take 2 tablets by mouth daily.     sildenafil  (REVATIO ) 20 MG tablet Take 20 mg by mouth 3 (three) times daily.     sildenafil  (VIAGRA ) 100 MG tablet TAKE 1/2 TO 1 TABLET BY MOUTH DAILY AS NEEDED FOR ERECTILE DYSFUNCTION 30 tablet 3   traZODone (DESYREL) 50 MG tablet Take 50-100 mg by mouth at bedtime as needed.     atomoxetine  (STRATTERA ) 80 MG capsule Take 1 capsule (80 mg total) by mouth daily. 90 capsule 3   No current facility-administered medications for this visit.    Allergies-reviewed and updated No Known Allergies  Social History   Socioeconomic History   Marital status: Married    Spouse name: Not on file   Number of children: Not on file   Years of education: Not on file   Highest education level: Bachelor's degree (e.g., BA, AB, BS)  Occupational History   Not on file  Tobacco Use   Smoking status: Never   Smokeless tobacco: Never  Vaping Use   Vaping status: Never Used  Substance and Sexual Activity   Alcohol use: Yes    Comment: occasional   Drug use: No   Sexual activity: Not on file  Other Topics Concern   Not on file  Social History Narrative   Not on file   Social Drivers of Health   Financial Resource Strain: Low Risk  (10/06/2023)   Overall Financial Resource Strain (CARDIA)    Difficulty of Paying Living Expenses: Not hard at all  Food Insecurity: No Food Insecurity (10/06/2023)   Hunger Vital Sign    Worried About Running Out of Food in the Last Year: Never true    Ran Out of Food in the Last Year: Never true  Transportation Needs: No Transportation Needs (10/06/2023)   PRAPARE - Administrator, Civil Service  (Medical): No    Lack of Transportation (Non-Medical): No  Physical Activity: Sufficiently Active (10/06/2023)   Exercise Vital Sign    Days of Exercise per Week: 4 days    Minutes of Exercise per Session: 50 min  Stress: Stress Concern Present (10/06/2023)   [employer] of Occupational Health - Occupational Stress Questionnaire    Feeling of Stress : Rather much  Social Connections: Moderately Isolated (10/06/2023)   Social Connection and Isolation Panel [NHANES]    Frequency of Communication with Friends and Family: Once a week    Frequency of Social Gatherings with Friends and Family: Once a week    Attends Religious Services: 1 to 4 times per year    Active Member of Clubs or Organizations: No    Attends  Engineer, Structural: Not on file    Marital Status: Married        Objective:  Physical Exam: BP 122/82   Pulse 69   Temp (!) 97.3 F (36.3 C) (Temporal)   Ht 6' 5 (1.956 m)   Wt 206 lb 3.2 oz (93.5 kg)   SpO2 99%   BMI 24.45 kg/m   Body mass index is 24.45 kg/m. Wt Readings from Last 3 Encounters:  10/10/23 206 lb 3.2 oz (93.5 kg)  12/29/22 215 lb (97.5 kg)  12/09/22 215 lb (97.5 kg)   Gen: NAD, resting comfortably HEENT: TMs normal bilaterally. OP clear. No thyromegaly noted.  CV: RRR with no murmurs appreciated Pulm: NWOB, CTAB with no crackles, wheezes, or rhonchi GI: Normal bowel sounds present. Soft, Nontender, Nondistended. MSK: no edema, cyanosis, or clubbing noted Skin: warm, dry Neuro: CN2-12 grossly intact. Strength 5/5 in upper and lower extremities. Reflexes symmetric and intact bilaterally.  Psych: Normal affect and thought content     Georganne Siple M. Kennyth, MD 10/10/2023 8:55 AM

## 2023-10-10 NOTE — Assessment & Plan Note (Signed)
Stable on Viagra 50 to 100 mg daily as needed.  Tolerates well.  No side effects.

## 2023-10-10 NOTE — Patient Instructions (Signed)
 It was very nice to see you today!  I will refill your Strattera .   We will check labs.   Please continue to work on diet and exercise.  I will see back in a year.  Come back sooner if needed.  Return in about 1 year (around 10/09/2024) for Annual Physical.   Take care, Dr Kennyth  PLEASE NOTE:  If you had any lab tests, please let us  know if you have not heard back within a few days. You may see your results on mychart before we have a chance to review them but we will give you a call once they are reviewed by us .   If we ordered any referrals today, please let us  know if you have not heard from their office within the next week.   If you had any urgent prescriptions sent in today, please check with the pharmacy within an hour of our visit to make sure the prescription was transmitted appropriately.   Please try these tips to maintain a healthy lifestyle:  Eat at least 3 REAL meals and 1-2 snacks per day.  Aim for no more than 5 hours between eating.  If you eat breakfast, please do so within one hour of getting up.   Each meal should contain half fruits/vegetables, one quarter protein, and one quarter carbs (no bigger than a computer mouse)  Cut down on sweet beverages. This includes juice, soda, and sweet tea.   Drink at least 1 glass of water with each meal and aim for at least 8 glasses per day  Exercise at least 150 minutes every week.    Preventive Care 39-29 Years Old, Male Preventive care refers to lifestyle choices and visits with your health care provider that can promote health and wellness. Preventive care visits are also called wellness exams. What can I expect for my preventive care visit? Counseling During your preventive care visit, your health care provider may ask about your: Medical history, including: Past medical problems. Family medical history. Current health, including: Emotional well-being. Home life and relationship well-being. Sexual  activity. Lifestyle, including: Alcohol, nicotine or tobacco, and drug use. Access to firearms. Diet, exercise, and sleep habits. Safety issues such as seatbelt and bike helmet use. Sunscreen use. Work and work astronomer. Physical exam Your health care provider will check your: Height and weight. These may be used to calculate your BMI (body mass index). BMI is a measurement that tells if you are at a healthy weight. Waist circumference. This measures the distance around your waistline. This measurement also tells if you are at a healthy weight and may help predict your risk of certain diseases, such as type 2 diabetes and high blood pressure. Heart rate and blood pressure. Body temperature. Skin for abnormal spots. What immunizations do I need?  Vaccines are usually given at various ages, according to a schedule. Your health care provider will recommend vaccines for you based on your age, medical history, and lifestyle or other factors, such as travel or where you work. What tests do I need? Screening Your health care provider may recommend screening tests for certain conditions. This may include: Lipid and cholesterol levels. Diabetes screening. This is done by checking your blood sugar (glucose) after you have not eaten for a while (fasting). Hepatitis B test. Hepatitis C test. HIV (human immunodeficiency virus) test. STI (sexually transmitted infection) testing, if you are at risk. Lung cancer screening. Prostate cancer screening. Colorectal cancer screening. Talk with your health care provider about  your test results, treatment options, and if necessary, the need for more tests. Follow these instructions at home: Eating and drinking  Eat a diet that includes fresh fruits and vegetables, whole grains, lean protein, and low-fat dairy products. Take vitamin and mineral supplements as recommended by your health care provider. Do not drink alcohol if your health care provider  tells you not to drink. If you drink alcohol: Limit how much you have to 0-2 drinks a day. Know how much alcohol is in your drink. In the [place] one drink equals one 12 oz bottle of beer (355 mL), one 5 oz glass of wine (148 mL), or one 1 oz glass of hard liquor (44 mL). Lifestyle Brush your teeth every morning and night with fluoride toothpaste. Floss one time each day. Exercise for at least 30 minutes 5 or more days each week. Do not use any products that contain nicotine or tobacco. These products include cigarettes, chewing tobacco, and vaping devices, such as e-cigarettes. If you need help quitting, ask your health care provider. Do not use drugs. If you are sexually active, practice safe sex. Use a condom or other form of protection to prevent STIs. Take aspirin only as told by your health care provider. Make sure that you understand how much to take and what form to take. Work with your health care provider to find out whether it is safe and beneficial for you to take aspirin daily. Find healthy ways to manage stress, such as: Meditation, yoga, or listening to music. Journaling. Talking to a trusted person. Spending time with friends and family. Minimize exposure to UV radiation to reduce your risk of skin cancer. Safety Always wear your seat belt while driving or riding in a vehicle. Do not drive: If you have been drinking alcohol. Do not ride with someone who has been drinking. When you are tired or distracted. While texting. If you have been using any mind-altering substances or drugs. Wear a helmet and other protective equipment during sports activities. If you have firearms in your house, make sure you follow all gun safety procedures. What's next? Go to your health care provider once a year for an annual wellness visit. Ask your health care provider how often you should have your eyes and teeth checked. Stay up to date on all vaccines. This information is not intended to  replace advice given to you by your health care provider. Make sure you discuss any questions you have with your health care provider. Document Revised: 02/17/2021 Document Reviewed: 02/17/2021 Elsevier Patient Education  2024 [employer].

## 2023-10-12 ENCOUNTER — Encounter: Payer: Self-pay | Admitting: Family Medicine

## 2023-10-12 NOTE — Progress Notes (Signed)
 His kidney numbers are up a little bit.  Recommend that he make sure he get plenty of fluids and come back here soon to recheck.  Cholesterol levels are borderline but better than last year.  Sugar is a little higher than last year but in the borderline range.  Do not need to start meds for either of these however he should continue to work on diet and exercise and we can recheck these in a year or so.  The rest of his labs are all at goal and we can recheck everything else in year.

## 2023-11-08 NOTE — Telephone Encounter (Unsigned)
 Copied from CRM [id number]. Topic: Clinical - Prescription Issue >> Nov 08, 2023  3:14 PM Alcus Dad wrote: Reason for CRM: Patient stated that insurance is requiring prior authorization for atomoxetine (STRATTERA) 80 MG capsule

## 2023-11-15 ENCOUNTER — Other Ambulatory Visit (HOSPITAL_COMMUNITY): Payer: Self-pay

## 2023-11-21 ENCOUNTER — Telehealth: Payer: Self-pay | Admitting: Pharmacy Technician

## 2023-11-21 ENCOUNTER — Other Ambulatory Visit (HOSPITAL_COMMUNITY): Payer: Self-pay

## 2023-11-21 NOTE — Telephone Encounter (Signed)
 Good afternoon, after submitting a test claim no PA is needed at this time. The next dill date is 01/22/24

## 2023-11-21 NOTE — Telephone Encounter (Signed)
 noted

## 2023-11-21 NOTE — Telephone Encounter (Signed)
 Pharmacy Patient Advocate Encounter   Received notification from Physician's Office that prior authorization for ATOMOXETINE 80MG  CAPSULES is not needed at this time.   Insurance verification completed.   The patient is insured through [facility] .   Per test claim: Refill too soon. PA is not needed at this time. Medication was filled 11/08/23. Next eligible fill date is 01/22/24.

## 2023-12-14 ENCOUNTER — Ambulatory Visit: Payer: Self-pay | Admitting: Podiatry

## 2023-12-14 ENCOUNTER — Encounter: Payer: Self-pay | Admitting: Podiatry

## 2023-12-14 DIAGNOSIS — B351 Tinea unguium: Secondary | ICD-10-CM | POA: Diagnosis not present

## 2023-12-14 NOTE — Addendum Note (Signed)
 Addended by: Daryel November on: 12/14/2023 01:14 PM   Modules accepted: Orders

## 2023-12-14 NOTE — Addendum Note (Signed)
 Addended by: Daryel November on: 12/14/2023 11:31 AM   Modules accepted: Orders

## 2023-12-14 NOTE — Progress Notes (Signed)
  Subjective:  Patient ID: David Huang, male    DOB: Jan 31, 1971,   MRN: [id number]  No chief complaint on file.   53 y.o. male presents for concern of left great toenail changes that have been present for years. Relates he used to play basketball and the toneail has been beat up. He has tried some OTC treatments for fungus with no changes and wanted to check it out . Denies any other pedal complaints. Denies n/v/f/c.   Past Medical History:  Diagnosis Date   Allergy    Arthritis    Fainting spell    Hyperlipidemia    Sleep apnea     Objective:  Physical Exam: Vascular: DP/PT pulses 2/4 bilateral. CFT <3 seconds. Normal hair growth on digits. No edema.  Skin. No lacerations or abrasions bilateral feet. Left hallux nail medial 1/4 of nail dystrophic discolored with subungual debris.  Musculoskeletal: MMT 5/5 bilateral lower extremities in DF, PF, Inversion and Eversion. Deceased ROM in DF of ankle joint.  Neurological: Sensation intact to light touch.   Assessment:   1. Onychomycosis      Plan:  Patient was evaluated and treated and all questions answered. -Examined patient -Discussed treatment options for painful dystrophic nails  -Clinical picture and Fungal culture was obtained by removing a portion of the hard nail itself from each of the involved toenails using a sterile nail nipper and sent to [facility] lab. Patient tolerated the biopsy procedure well without discomfort or need for anesthesia.  -Discussed fungal nail treatment options including oral, topical, and laser treatments.  -Patient to return in 4 weeks for follow up evaluation and discussion of fungal culture results or sooner if symptoms worsen.   Louann Sjogren, DPM

## 2024-01-02 ENCOUNTER — Other Ambulatory Visit: Payer: Self-pay | Admitting: Podiatry

## 2024-01-11 ENCOUNTER — Encounter: Payer: Self-pay | Admitting: Podiatry

## 2024-01-11 ENCOUNTER — Ambulatory Visit: Admitting: Podiatry

## 2024-01-11 DIAGNOSIS — L603 Nail dystrophy: Secondary | ICD-10-CM

## 2024-01-11 NOTE — Progress Notes (Signed)
  Subjective:  Patient ID: David Huang, male    DOB: 06-12-1971,   MRN: [id number]  No chief complaint on file.   53 y.o. male presents for follow-up fungal nails and to go over culture results. . Denies any other pedal complaints. Denies n/v/f/c.   Past Medical History:  Diagnosis Date   Allergy    Arthritis    Fainting spell    Hyperlipidemia    Sleep apnea     Objective:  Physical Exam: Vascular: DP/PT pulses 2/4 bilateral. CFT <3 seconds. Normal hair growth on digits. No edema.  Skin. No lacerations or abrasions bilateral feet. Left hallux nail medial 1/4 of nail dystrophic discolored with subungual debris.  Musculoskeletal: MMT 5/5 bilateral lower extremities in DF, PF, Inversion and Eversion. Deceased ROM in DF of ankle joint.  Neurological: Sensation intact to light touch.   Assessment:   1. Onychodystrophy      Plan:  Patient was evaluated and treated and all questions answered. -Examined patient -Discussed treatment options for painful dystrophic nails  -Culture negative for fungus.  -Discussed treatments including shoe gear modification and urea nail gel.  -Patient to return as needed   Jennefer Moats, DPM

## 2024-01-29 ENCOUNTER — Other Ambulatory Visit: Payer: Self-pay | Admitting: Family Medicine

## 2024-01-29 DIAGNOSIS — N529 Male erectile dysfunction, unspecified: Secondary | ICD-10-CM

## 2024-02-23 ENCOUNTER — Encounter: Payer: Self-pay | Admitting: Family Medicine

## 2024-02-23 ENCOUNTER — Ambulatory Visit: Admitting: Family Medicine

## 2024-02-23 VITALS — BP 109/62 | HR 68 | Temp 97.2°F | Ht 77.0 in | Wt 210.8 lb

## 2024-02-23 DIAGNOSIS — R350 Frequency of micturition: Secondary | ICD-10-CM

## 2024-02-23 LAB — URINALYSIS, ROUTINE W REFLEX MICROSCOPIC
Bilirubin Urine: NEGATIVE
Ketones, ur: NEGATIVE
Leukocytes,Ua: NEGATIVE
Nitrite: NEGATIVE
Specific Gravity, Urine: >=1.03 — AB (ref 1.000–1.030)
Urine Glucose: NEGATIVE
Urobilinogen, UA: 0.2 (ref 0.0–1.0)
pH: 6 (ref 5.0–8.0)

## 2024-02-23 LAB — POCT URINALYSIS DIPSTICK
Bilirubin, UA: NEGATIVE
Blood, UA: POSITIVE
Glucose, UA: NEGATIVE
Ketones, UA: NEGATIVE
Leukocytes, UA: NEGATIVE
Nitrite, UA: NEGATIVE
Protein, UA: POSITIVE — AB
Spec Grav, UA: >=1.03 — AB (ref 1.010–1.025)
Urobilinogen, UA: 0.2 U/dL
pH, UA: 6 (ref 5.0–8.0)

## 2024-02-23 MED ORDER — NITROFURANTOIN MONOHYD MACRO 100 MG PO CAPS: 100.0000 mg | ORAL_CAPSULE | Freq: Two times a day (BID) | ORAL | 0 refills | Status: DC

## 2024-02-23 NOTE — Progress Notes (Signed)
   David Huang is a 53 y.o. male who presents today for an office visit.  Assessment/Plan:  Urinary frequency/hematuria Concern for UTI.  Point-of-care urinalysis with positive hemoglobin and protein.  No red flags or signs of systemic illness.  Nephrolithiasis is also possibility however much less likely given location of his back pain.  Will empirically start Macrobid while we await culture results.  We encouraged hydration.  Also encouraged patient to strain urine in the next couple of days to see if he passes any stone.  If urine culture is negative for UTI then would consider CT scan vs urology referral to rule out nephrolithiasis or other causes for his hematuria.  We will need to repeat UA in a few weeks to ensure resolution of hematuria.    Subjective:  HPI:  See Assessment / plan for status of chronic conditions.  Patient here with urinary frequency.  This started a couple weeks ago.  He has had some urgency associated with this as well.  He has noticed bilateral lower back pain over the last couple of weeks as well.  No fevers or chills.  No visible hematuria.  No nausea or vomiting.  He has had a little bit more fatigue recently.       Objective:  Physical Exam: Ht 6' 5 (1.956 m)   BMI 24.45 kg/m   Gen: No acute distress, resting comfortably CV: Regular rate and rhythm with no murmurs appreciated Pulm: Normal work of breathing, clear to auscultation bilaterally with no crackles, wheezes, or rhonchi MUSCULOSKELETAL: No CVA tenderness Neuro: Grossly normal, moves all extremities Psych: Normal affect and thought content      Theseus Birnie M. Daneil Dunker, MD 02/23/2024 10:10 AM

## 2024-02-24 LAB — URINE CULTURE
MICRO NUMBER:: 16606272
Result:: NO GROWTH
SPECIMEN QUALITY:: ADEQUATE

## 2024-02-26 ENCOUNTER — Ambulatory Visit: Payer: Self-pay | Admitting: Family Medicine

## 2024-02-26 NOTE — Progress Notes (Signed)
 His urine culture was negative for infection. Can we check on the patient to see how his symptoms are progressing?  Recommend he come back here this week to recheck UA to see if he still has blood in his urine.  Please place order for send out urinalysis.

## 2024-02-28 ENCOUNTER — Other Ambulatory Visit: Payer: Self-pay | Admitting: *Deleted

## 2024-02-28 DIAGNOSIS — R319 Hematuria, unspecified: Secondary | ICD-10-CM

## 2024-02-29 ENCOUNTER — Other Ambulatory Visit (INDEPENDENT_AMBULATORY_CARE_PROVIDER_SITE_OTHER)

## 2024-02-29 DIAGNOSIS — R319 Hematuria, unspecified: Secondary | ICD-10-CM

## 2024-02-29 LAB — URINALYSIS, ROUTINE W REFLEX MICROSCOPIC
Bilirubin Urine: NEGATIVE
Ketones, ur: NEGATIVE
Leukocytes,Ua: NEGATIVE
Nitrite: NEGATIVE
Specific Gravity, Urine: 1.02 (ref 1.000–1.030)
Urine Glucose: NEGATIVE
Urobilinogen, UA: 0.2 (ref 0.0–1.0)
pH: 6 (ref 5.0–8.0)

## 2024-03-04 ENCOUNTER — Ambulatory Visit: Payer: Self-pay | Admitting: Family Medicine

## 2024-03-04 NOTE — Progress Notes (Signed)
 He still has a small amount of blood in his urine.  Recommend referral to urology for further evaluation or we can order an abdomina CT scan without contrast to rule out kidney stones if he prefers to do this before seeing urology.

## 2024-03-06 ENCOUNTER — Other Ambulatory Visit: Payer: Self-pay

## 2024-03-06 ENCOUNTER — Telehealth: Payer: Self-pay | Admitting: *Deleted

## 2024-03-06 DIAGNOSIS — R319 Hematuria, unspecified: Secondary | ICD-10-CM

## 2024-03-06 NOTE — Telephone Encounter (Signed)
 Copied from CRM [id number]. Topic: General - Other >> Mar 06, 2024  1:42 PM Thersia C wrote: Reason for CRM: [facility] called regarding patient , stated they received CT Pelvis without contrast, should be a stone study need for CT Abdomen without contrast instead of contrast scheduled for July 11th  New order for CT Abdomen without contrast put in Epic.

## 2024-03-15 ENCOUNTER — Ambulatory Visit
Admission: RE | Admit: 2024-03-15 | Discharge: 2024-03-15 | Disposition: A | Discharge status: Home / Self Care | Source: Ambulatory Visit | Attending: Family Medicine | Admitting: Family Medicine

## 2024-03-15 DIAGNOSIS — R319 Hematuria, unspecified: Secondary | ICD-10-CM

## 2024-03-18 ENCOUNTER — Ambulatory Visit: Payer: Self-pay | Admitting: Family Medicine

## 2024-03-18 DIAGNOSIS — R319 Hematuria, unspecified: Secondary | ICD-10-CM

## 2024-03-18 NOTE — Telephone Encounter (Signed)
See patient note

## 2024-03-18 NOTE — Telephone Encounter (Signed)
 Bladder and kidney cancer is rare and nothing showed up on his CT scan but the urologist will help us  definitely rule this out.  Worth HERO. Kennyth, MD 03/18/2024 11:42 AM

## 2024-03-18 NOTE — Progress Notes (Signed)
 CT scan did not show any kidney stones.  Recommend referral to urology for further evaluation.

## 2024-06-10 ENCOUNTER — Other Ambulatory Visit: Payer: Self-pay | Admitting: Family Medicine

## 2024-06-10 DIAGNOSIS — N529 Male erectile dysfunction, unspecified: Secondary | ICD-10-CM

## 2024-06-28 ENCOUNTER — Other Ambulatory Visit: Payer: Self-pay | Admitting: Family Medicine

## 2024-06-28 ENCOUNTER — Encounter: Payer: Self-pay | Admitting: Family Medicine

## 2024-07-01 NOTE — Telephone Encounter (Signed)
 Rx was sent by PCP 07/01/24

## 2024-08-08 ENCOUNTER — Other Ambulatory Visit: Payer: Self-pay | Admitting: Urology

## 2024-08-12 NOTE — Patient Instructions (Signed)
 SURGICAL WAITING ROOM VISITATION Patients having surgery or a procedure may have no more than 2 support people in the waiting area - these visitors may rotate in the visitor waiting room.   Due to an increase in RSV and influenza rates and associated hospitalizations, children ages 26 and under may not visit patients in [facility] hospitals. If the patient needs to stay at the hospital during part of their recovery, the visitor guidelines for inpatient rooms apply.  PRE-OP VISITATION  Pre-op nurse will coordinate an appropriate time for 1 support person to accompany the patient in pre-op.  This support person may not rotate.  This visitor will be contacted when the time is appropriate for the visitor to come back in the pre-op area.  Please refer to the [facility] website for the visitor guidelines for Inpatients (after your surgery is over and you are in a regular room).  You are not required to quarantine at this time prior to your surgery. However, you must do this: Hand Hygiene often Do NOT share personal items Notify your provider if you are in close contact with someone who has COVID or you develop fever 100.4 or greater, new onset of sneezing, cough, sore throat, shortness of breath or body aches.  If you test positive for Covid or have been in contact with anyone that has tested positive in the last 10 days please notify you surgeon.    Your procedure is scheduled on: 08/14/24   Report to [facility] Main Entrance: [place] entrance where the [employer] is available.   Report to admitting at: 1:00 PM  Call this number if you have any questions or problems the morning of surgery [phone]  FOLLOW ANY ADDITIONAL PRE OP INSTRUCTIONS YOU RECEIVED FROM YOUR SURGEON'S OFFICE!!!  Do not eat food after Midnight the night prior to your surgery/procedure.  After Midnight you may have the following liquids until: 9:00 AM DAY OF SURGERY  Clear Liquid Diet Water Black  Coffee (sugar ok, NO MILK/CREAM OR CREAMERS)  Tea (sugar ok, NO MILK/CREAM OR CREAMERS) regular and decaf                             Plain Jell-O  with no fruit (NO RED)                                           Fruit ices (not with fruit pulp, NO RED)                                     Popsicles (NO RED)                                                                  Juice: NO CITRUS JUICES: only apple, WHITE grape, WHITE cranberry Sports drinks like Gatorade or Powerade (NO RED)            Oral Hygiene is also important to reduce your risk of infection.        Remember - BRUSH YOUR  TEETH THE MORNING OF SURGERY WITH YOUR REGULAR TOOTHPASTE  Do NOT smoke after Midnight the night before surgery.  STOP TAKING all Vitamins, Herbs and supplements 1 week before your surgery.   Take ONLY these medicines the morning of surgery with A SIP OF WATER: NONE.                   You may not have any metal on your body including hair pins, jewelry, and body piercing  Do not wear lotions, powders, perfumes / cologne, or deodorant  Men may shave face and neck.  Contacts, Hearing Aids, dentures or bridgework may not be worn into surgery. DENTURES WILL BE REMOVED PRIOR TO SURGERY PLEASE DO NOT APPLY Poly grip OR ADHESIVES!!!  You may bring a small overnight bag with you on the day of surgery, only pack items that are not valuable. [facility] IS NOT RESPONSIBLE   FOR VALUABLES THAT ARE LOST OR STOLEN.   Patients discharged on the day of surgery will not be allowed to drive home.  Someone NEEDS to stay with you for the first 24 hours after anesthesia.  Do not bring your home medications to the hospital. The Pharmacy will dispense medications listed on your medication list to you during your admission in the Hospital.  Special Instructions: Bring a copy of your healthcare power of attorney and living will documents the day of surgery, if you wish to have them scanned into your [facility]  Records- EPIC  Please read over the following fact sheets you were given: IF YOU HAVE QUESTIONS ABOUT YOUR PRE-OP INSTRUCTIONS, PLEASE CALL [phone]   [facility] - Preparing for Surgery      Before surgery, you can play an important role.  Because skin is not sterile, your skin needs to be as free of germs as possible.  You can reduce the number of germs on your skin by washing with CHG (chlorahexidine gluconate) soap before surgery.  CHG is an antiseptic cleaner which kills germs and bonds with the skin to continue killing germs even after washing. Please DO NOT use if you have an allergy to CHG or antibacterial soaps.  If your skin becomes reddened/irritated stop using the CHG and inform your nurse when you arrive at Short Stay. Do not shave (including legs and underarms) for at least 48 hours prior to the first CHG shower.  You may shave your face/neck.  Please follow these instructions carefully:  1.  Shower with CHG Soap the night before surgery ONLY (DO NOT USE THE SOAP THE MORNING OF SURGERY).  2.  If you choose to wash your hair, wash your hair first as usual with your normal  shampoo.  3.  After you shampoo, rinse your hair and body thoroughly to remove the shampoo.                             4.  Use CHG as you would any other liquid soap.  You can apply chg directly to the skin and wash.  Gently with a scrungie or clean washcloth.  5.  Apply the CHG Soap to your body ONLY FROM THE NECK DOWN.   Do not use on face/ open                           Wound or open sores. Avoid contact with eyes, ears mouth and genitals (private parts).  Wash face,  Genitals (private parts) with your normal soap.             6.  Wash thoroughly, paying special attention to the area where your  surgery  will be performed.  7.  Thoroughly rinse your body with warm water from the neck down.  8.  DO NOT shower/wash with your normal soap after using and rinsing off the CHG Soap.                 9.  Pat yourself dry with a clean towel.            10.  Wear clean pajamas.            11.  Place clean sheets on your bed the night of your first shower and do not  sleep with pets.  Day of Surgery : Do not apply any CHG, lotions/deodorants the morning of surgery.  Please wear clean clothes to the hospital/surgery center.   FAILURE TO FOLLOW THESE INSTRUCTIONS MAY RESULT IN THE CANCELLATION OF YOUR SURGERY  PATIENT SIGNATURE_________________________________  NURSE SIGNATURE__________________________________  ________________________________________________________________________

## 2024-08-13 ENCOUNTER — Encounter (HOSPITAL_COMMUNITY)
Admission: RE | Admit: 2024-08-13 | Discharge: 2024-08-13 | Disposition: A | Source: Ambulatory Visit | Attending: Urology

## 2024-08-13 ENCOUNTER — Encounter (HOSPITAL_COMMUNITY): Payer: Self-pay

## 2024-08-13 ENCOUNTER — Other Ambulatory Visit: Payer: Self-pay

## 2024-08-13 VITALS — BP 122/74 | HR 94 | Temp 98.6°F | Ht 77.0 in | Wt 207.0 lb

## 2024-08-13 DIAGNOSIS — Z01818 Encounter for other preprocedural examination: Secondary | ICD-10-CM

## 2024-08-13 LAB — CBC
HCT: 41.7 % (ref 39.0–52.0)
Hemoglobin: 14 g/dL (ref 13.0–17.0)
MCH: 32.9 pg (ref 26.0–34.0)
MCHC: 33.6 g/dL (ref 30.0–36.0)
MCV: 97.9 fL (ref 80.0–100.0)
Platelets: 220 K/uL (ref 150–400)
RBC: 4.26 MIL/uL (ref 4.22–5.81)
RDW: 14.4 % (ref 11.5–15.5)
WBC: 4.8 K/uL (ref 4.0–10.5)
nRBC: 0 % (ref 0.0–0.2)

## 2024-08-13 NOTE — Progress Notes (Addendum)
 For Anesthesia: PCP - Kennyth Worth HERO, MD  Cardiologist - N/A  Bowel Prep reminder:  Chest x-ray -  EKG -  Stress Test -  ECHO -  Cardiac Cath -  Pacemaker/ICD device last checked: Pacemaker orders received: Device Rep notified:  Spinal Cord Stimulator:N/A  Sleep Study - Yes CPAP - NO  Fasting Blood Sugar - N/A Checks Blood Sugar _____ times a day Date and result of last Hgb A1c-  Last dose of GLP1 agonist- N/A GLP1 instructions: Hold 7 days prior to schedule (Hold 24 hours-daily)   Last dose of SGLT-2 inhibitors- N/A SGLT-2 instructions: Hold 72 hours prior to surgery  Blood Thinner Instructions:N/A Last Dose: Time last taken:  Aspirin Instructions:N/A Last Dose: Time last taken:  Activity level: Can go up a flight of stairs and activities of daily living without stopping and without chest pain and/or shortness of breath   Able to exercise without chest pain and/or shortness of breath  Anesthesia review:   Patient denies shortness of breath, fever, cough and chest pain at PAT appointment   Patient verbalized understanding of instructions that were reviewed over the telephone.

## 2024-08-14 ENCOUNTER — Encounter (HOSPITAL_COMMUNITY): Payer: Self-pay | Admitting: Urology

## 2024-08-14 ENCOUNTER — Ambulatory Visit (HOSPITAL_COMMUNITY): Admission: RE | Admit: 2024-08-14 | Discharge: 2024-08-14 | Disposition: A | Attending: Urology | Admitting: Urology

## 2024-08-14 ENCOUNTER — Other Ambulatory Visit: Payer: Self-pay

## 2024-08-14 ENCOUNTER — Ambulatory Visit (HOSPITAL_COMMUNITY): Admitting: Anesthesiology

## 2024-08-14 ENCOUNTER — Encounter (HOSPITAL_COMMUNITY): Admission: RE | Disposition: A | Payer: Self-pay | Source: Home / Self Care | Attending: Urology

## 2024-08-14 ENCOUNTER — Other Ambulatory Visit: Payer: Self-pay | Admitting: Urology

## 2024-08-14 ENCOUNTER — Encounter (HOSPITAL_COMMUNITY): Payer: Self-pay | Admitting: Medical

## 2024-08-14 DIAGNOSIS — G4730 Sleep apnea, unspecified: Secondary | ICD-10-CM | POA: Diagnosis not present

## 2024-08-14 DIAGNOSIS — N368 Other specified disorders of urethra: Secondary | ICD-10-CM | POA: Diagnosis not present

## 2024-08-14 DIAGNOSIS — D413 Neoplasm of uncertain behavior of urethra: Secondary | ICD-10-CM | POA: Diagnosis present

## 2024-08-14 DIAGNOSIS — N369 Urethral disorder, unspecified: Secondary | ICD-10-CM | POA: Diagnosis not present

## 2024-08-14 HISTORY — PX: CYSTOSCOPY WITH BIOPSY: SHX5122

## 2024-08-14 SURGERY — CYSTOSCOPY, WITH BIOPSY
Anesthesia: General

## 2024-08-14 MED ORDER — ORAL CARE MOUTH RINSE: 15.0000 mL | Freq: Once | OROMUCOSAL | Status: AC

## 2024-08-14 MED ORDER — LACTATED RINGERS IV SOLN: INTRAVENOUS | Status: DC

## 2024-08-14 MED ORDER — PROPOFOL 10 MG/ML IV BOLUS
INTRAVENOUS | Status: AC
  Filled 2024-08-14: qty 20

## 2024-08-14 MED ORDER — CHLORHEXIDINE GLUCONATE 0.12 % MT SOLN
15.0000 mL | Freq: Once | OROMUCOSAL | Status: AC
  Administered 2024-08-14: 15 mL via OROMUCOSAL

## 2024-08-14 MED ORDER — CEFAZOLIN SODIUM-DEXTROSE 2-4 GM/100ML-% IV SOLN
2.0000 g | INTRAVENOUS | Status: AC
  Administered 2024-08-14: 2 g via INTRAVENOUS
  Filled 2024-08-14: qty 100

## 2024-08-14 MED ORDER — FENTANYL CITRATE (PF) 100 MCG/2ML IJ SOLN
INTRAMUSCULAR | Status: DC | PRN
  Administered 2024-08-14 (×2): 50 ug via INTRAVENOUS

## 2024-08-14 MED ORDER — PHENYLEPHRINE 80 MCG/ML (10ML) SYRINGE FOR IV PUSH (FOR BLOOD PRESSURE SUPPORT)
PREFILLED_SYRINGE | INTRAVENOUS | Status: AC
  Filled 2024-08-14: qty 50

## 2024-08-14 MED ORDER — DROPERIDOL 2.5 MG/ML IJ SOLN: 0.6250 mg | Freq: Once | INTRAMUSCULAR | Status: DC | PRN

## 2024-08-14 MED ORDER — SODIUM CHLORIDE 0.9 % IR SOLN
Status: DC | PRN
  Administered 2024-08-14: 3000 mL via INTRAVESICAL

## 2024-08-14 MED ORDER — OXYCODONE HCL 5 MG/5ML PO SOLN: 5.0000 mg | Freq: Once | ORAL | Status: DC | PRN

## 2024-08-14 MED ORDER — OXYCODONE HCL 5 MG PO TABS: 5.0000 mg | ORAL_TABLET | Freq: Once | ORAL | Status: DC | PRN

## 2024-08-14 MED ORDER — PROPOFOL 10 MG/ML IV BOLUS
INTRAVENOUS | Status: DC | PRN
  Administered 2024-08-14: 100 mg via INTRAVENOUS
  Administered 2024-08-14: 200 mg via INTRAVENOUS

## 2024-08-14 MED ORDER — FENTANYL CITRATE (PF) 50 MCG/ML IJ SOSY: 25.0000 ug | PREFILLED_SYRINGE | INTRAMUSCULAR | Status: DC | PRN

## 2024-08-14 MED ORDER — LACTATED RINGERS IV SOLN: INTRAVENOUS | Status: DC | PRN

## 2024-08-14 MED ORDER — MIDAZOLAM HCL (PF) 2 MG/2ML IJ SOLN
INTRAMUSCULAR | Status: DC | PRN
  Administered 2024-08-14: 2 mg via INTRAVENOUS

## 2024-08-14 MED ORDER — DEXAMETHASONE SODIUM PHOSPHATE 4 MG/ML IJ SOLN
INTRAMUSCULAR | Status: DC | PRN
  Administered 2024-08-14: 5 mg via INTRAVENOUS

## 2024-08-14 MED ORDER — ONDANSETRON HCL 4 MG/2ML IJ SOLN
INTRAMUSCULAR | Status: DC | PRN
  Administered 2024-08-14: 4 mg via INTRAVENOUS

## 2024-08-14 MED ORDER — PHENAZOPYRIDINE HCL 200 MG PO TABS: 200.0000 mg | ORAL_TABLET | Freq: Three times a day (TID) | ORAL | 0 refills | Status: AC | PRN

## 2024-08-14 MED ORDER — MIDAZOLAM HCL 2 MG/2ML IJ SOLN
INTRAMUSCULAR | Status: AC
  Filled 2024-08-14: qty 2

## 2024-08-14 MED ORDER — FENTANYL CITRATE (PF) 100 MCG/2ML IJ SOLN
INTRAMUSCULAR | Status: AC
  Filled 2024-08-14: qty 2

## 2024-08-14 SURGICAL SUPPLY — 14 items
BAG URINE DRAIN 2000ML AR STRL (UROLOGICAL SUPPLIES) IMPLANT
BAG URO CATCHER STRL LF (MISCELLANEOUS) ×1 IMPLANT
BRUSH URET BIOPSY 3F (UROLOGICAL SUPPLIES) IMPLANT
DRAPE FOOT SWITCH (DRAPES) ×1 IMPLANT
ELECT REM PT RETURN 15FT ADLT (MISCELLANEOUS) ×1 IMPLANT
GLOVE SURG LX STRL 8.0 MICRO (GLOVE) ×1 IMPLANT
GOWN STRL SURGICAL XL XLNG (GOWN DISPOSABLE) ×1 IMPLANT
KIT TURNOVER KIT A (KITS) ×1 IMPLANT
LOOP CUT BIPOLAR 24F LRG (ELECTROSURGICAL) IMPLANT
MANIFOLD NEPTUNE II (INSTRUMENTS) ×1 IMPLANT
PACK CYSTO (CUSTOM PROCEDURE TRAY) ×1 IMPLANT
SYRINGE TOOMEY IRRIG 70ML (MISCELLANEOUS) IMPLANT
TUBING CONNECTING 10 (TUBING) ×1 IMPLANT
TUBING UROLOGY SET (TUBING) ×1 IMPLANT

## 2024-08-14 NOTE — Op Note (Signed)
 Operative Note  Preoperative diagnosis:  1.  Urethral lesions 2.  Microscopic materia   Postoperative diagnosis: 1.  Urethral lesions 2.  Microscopic materia  Procedure(s): 1.  Cystoscopy with brush biopsy of urethra  Surgeon: Lonni Han, MD  Assistants:  None  Anesthesia:  General  Complications:  None  EBL: None  Specimens: 1.  Brush biopsy 2.  Urethral washings for cytology  Drains/Catheters: 1.  None  Intraoperative findings:   Small (less than 1 mm) papillary like lesions scattered throughout the pendulous urethral mucosa  Indication:  David Huang is a 53 y.o. male with history of microscopic hematuria.  During recent office cystoscopy to complete his hematuria evaluation, the patient was found to have small papillary like mucosal projections measuring less than 1 mm scattered throughout the pendulous urethra.  He is here today for a biopsy of said lesions.  He has been consented for the above procedure, voices understanding and wishes to proceed.  Description of procedure:  After informed consent was obtained, the patient was brought to the operating room and general LMA anesthesia was administered. The patient was then placed in the dorsolithotomy position and prepped and draped in the usual sterile fashion. A timeout was performed. A 21 French rigid cystoscope was then inserted into the urethral meatus and advanced into the bladder under direct vision. A complete bladder survey revealed no intravesical pathology.  The scope was then retracted into the pendulous urethra where a breast biopsy was obtained from the mucosal projection seen scattered throughout the mucosa.  The brush was sent for pathologic analysis.  Urethral washings were also obtained for cytology.  There was no significant urethral mucosal trauma or evidence of active bleeding.  Patient's bladder was then drained.  He tolerated the procedure well and was transferred the postanesthesia in stable  condition.  Plan: Follow-up on 08/23/2024 to discuss pathology results

## 2024-08-14 NOTE — Transfer of Care (Signed)
 Immediate Anesthesia Transfer of Care Note  Patient: David Huang  Procedure(s) Performed: CYSTOSCOPY, WITH BRUSH BIOPSY  Patient Location: PACU  Anesthesia Type:General  Level of Consciousness: awake and alert   Airway & Oxygen Therapy: Patient Spontanous Breathing and Patient connected to nasal cannula oxygen  Post-op Assessment: Report given to RN and Post -op Vital signs reviewed and stable  Post vital signs: Reviewed and stable  Last Vitals:  Vitals Value Taken Time  BP 90/47 08/14/24 17:45  Temp    Pulse 96 08/14/24 17:51  Resp 18 08/14/24 17:51  SpO2 98 % 08/14/24 17:51  Vitals shown include unfiled device data.  Last Pain:  Vitals:   08/14/24 1355  TempSrc: Oral  PainSc: 0-No pain         Complications: No notable events documented.

## 2024-08-14 NOTE — Anesthesia Procedure Notes (Signed)
 Procedure Name: Intubation Date/Time: 08/14/2024 4:56 PM  Performed by: Javis Abboud, CRNAPre-anesthesia Checklist: Patient identified, Emergency Drugs available, Suction available and Patient being monitored Patient Re-evaluated:Patient Re-evaluated prior to induction Oxygen Delivery Method: Circle system utilized Preoxygenation: Pre-oxygenation with 100% oxygen Induction Type: IV induction Ventilation: Mask ventilation without difficulty LMA: LMA inserted LMA Size: 4.0 Tube type: Oral Number of attempts: 1 Airway Equipment and Method: Oral airway Placement Confirmation: positive ETCO2 and breath sounds checked- equal and bilateral Tube secured with: Tape Dental Injury: Teeth and Oropharynx as per pre-operative assessment

## 2024-08-14 NOTE — Anesthesia Postprocedure Evaluation (Signed)
 Anesthesia Post Note  Patient: David Huang  Procedure(s) Performed: CYSTOSCOPY, WITH BRUSH BIOPSY     Patient location during evaluation: PACU Anesthesia Type: General Level of consciousness: awake and alert Pain management: pain level controlled Vital Signs Assessment: post-procedure vital signs reviewed and stable Respiratory status: spontaneous breathing, nonlabored ventilation, respiratory function stable and patient connected to nasal cannula oxygen Cardiovascular status: blood pressure returned to baseline and stable Postop Assessment: no apparent nausea or vomiting Anesthetic complications: no   No notable events documented.  Last Vitals:  Vitals:   08/14/24 1800 08/14/24 1815  BP: 112/66 (!) 134/91  Pulse: 77 73  Resp: 18 14  Temp:    SpO2: 100% 100%    Last Pain:  Vitals:   08/14/24 1815  TempSrc:   PainSc: 0-No pain                 [name]

## 2024-08-14 NOTE — Anesthesia Preprocedure Evaluation (Signed)
 Anesthesia Evaluation  Patient identified by MRN, date of birth, ID band Patient awake    Reviewed: Allergy & Precautions, NPO status , Patient's Chart, lab work & pertinent test results  Airway Mallampati: II  TM Distance: >3 FB Neck ROM: Full    Dental  (+) Dental Advisory Given   Pulmonary sleep apnea    breath sounds clear to auscultation       Cardiovascular negative cardio ROS  Rhythm:Regular Rate:Normal     Neuro/Psych negative neurological ROS     GI/Hepatic negative GI ROS, Neg liver ROS,,,  Endo/Other  negative endocrine ROS    Renal/GU negative Renal ROS     Musculoskeletal  (+) Arthritis ,    Abdominal   Peds  Hematology negative hematology ROS (+)   Anesthesia Other Findings   Reproductive/Obstetrics                              Anesthesia Physical Anesthesia Plan  ASA: 2  Anesthesia Plan: General   Post-op Pain Management: Ofirmev  IV (intra-op)*   Induction: Intravenous  PONV Risk Score and Plan: 2 and Dexamethasone and Treatment may vary due to age or medical condition  Airway Management Planned: LMA  Additional Equipment:   Intra-op Plan:   Post-operative Plan: Extubation in OR  Informed Consent: I have reviewed the patients History and Physical, chart, labs and discussed the procedure including the risks, benefits and alternatives for the proposed anesthesia with the patient or authorized representative who has indicated his/her understanding and acceptance.     Dental advisory given  Plan Discussed with: CRNA  Anesthesia Plan Comments:         Anesthesia Quick Evaluation

## 2024-08-14 NOTE — H&P (Signed)
 Urology Preoperative H&P   Chief Complaint: urethral abnormality   History of Present Illness: David Huang is a 53 y.o. male with a history of microscopic hematuria.  He recently had a CTU that showed no GU abnormalities, but office cystoscopy revealed papillary-like mucosal lesions throughout the urethra.  He is here today for a cystoscopy and urethral brush biopsy of said lesions.      Past Medical History:  Diagnosis Date   Allergy    Arthritis    Fainting spell    Hyperlipidemia    Sleep apnea     Past Surgical History:  Procedure Laterality Date   ANKLE SURGERY Left    BACK SURGERY     KNEE ARTHROSCOPY WITH MENISCAL REPAIR Left    KNEE SURGERY Left    SHOULDER ARTHROSCOPY WITH LABRAL REPAIR Left    TONSILLECTOMY      Allergies: No Known Allergies  Family History  Problem Relation Age of Onset   Colon polyps Mother    Cancer Father    Liver cancer Father    Cancer Paternal Aunt    Prostate cancer Neg Hx    Colon cancer Neg Hx    Esophageal cancer Neg Hx    Rectal cancer Neg Hx    Stomach cancer Neg Hx     Social History:  reports that he has never smoked. He has never used smokeless tobacco. He reports current alcohol use. He reports current drug use. Drug: Marijuana.  ROS: A complete review of systems was performed.  All systems are negative except for pertinent findings as noted.  Physical Exam:  Vital signs in last 24 hours: Temp:  [98.6 F (37 C)] 98.6 F (37 C) (12/09 0824) Pulse Rate:  [94] 94 (12/09 0824) BP: (122)/(74) 122/74 (12/09 0824) SpO2:  [100 %] 100 % (12/09 0824) Weight:  [93.9 kg] 93.9 kg (12/09 0824) Constitutional:  Alert and oriented, No acute distress Cardiovascular: Regular rate and rhythm, No JVD Respiratory: Normal respiratory effort, Lungs clear bilaterally GI: Abdomen is soft, nontender, nondistended, no abdominal masses GU: No CVA tenderness Lymphatic: No lymphadenopathy Neurologic: Grossly intact, no focal  deficits Psychiatric: Normal mood and affect  Laboratory Data:  Recent Labs    08/13/24 0855  WBC 4.8  HGB 14.0  HCT 41.7  PLT 220    No results for input(s): NA, K, CL, GLUCOSE, BUN, CALCIUM, CREATININE in the last 72 hours.  Invalid input(s): CO3   Results for orders placed or performed during the hospital encounter of 08/13/24 (from the past 24 hours)  CBC per protocol     Status: None   Collection Time: 08/13/24  8:55 AM  Result Value Ref Range   WBC 4.8 4.0 - 10.5 K/uL   RBC 4.26 4.22 - 5.81 MIL/uL   Hemoglobin 14.0 13.0 - 17.0 g/dL   HCT 58.2 60.9 - 47.9 %   MCV 97.9 80.0 - 100.0 fL   MCH 32.9 26.0 - 34.0 pg   MCHC 33.6 30.0 - 36.0 g/dL   RDW 85.5 88.4 - 84.4 %   Platelets 220 150 - 400 K/uL   nRBC 0.0 0.0 - 0.2 %   No results found for this or any previous visit (from the past 240 hours).  Renal Function: No results for input(s): CREATININE in the last 168 hours. CrCl cannot be calculated (Patient's most recent lab result is older than the maximum 21 days allowed.).  Radiologic Imaging: No results found.  I independently reviewed the above imaging studies.  Assessment and Plan David Huang is a 53 y.o. male with urethral lesions of unknown etiology  The risk, benefits and alternatives of cystoscopy with brush biopsy of the urethra were discussed in detail. Risks include, but are not limited to, bleeding, urethral stricture formation, urinary tract infection, the potential need for a Foley catheter and the inherent risk of general anesthesia. He voices understanding and wishes to proceed.   Lonni Han, MD 08/14/2024, 7:55 AM  [facility] Pager: [phone]

## 2024-08-15 ENCOUNTER — Encounter (HOSPITAL_COMMUNITY): Payer: Self-pay | Admitting: Urology

## 2024-08-16 LAB — CYTOLOGY - NON PAP

## 2024-09-14 ENCOUNTER — Encounter: Payer: Self-pay | Admitting: Family Medicine

## 2024-09-15 ENCOUNTER — Telehealth
